# Patient Record
Sex: Male | Born: 1955
Health system: Southern US, Community
[De-identification: ages and names within clinical notes are randomized; demographics above are authoritative.]

## PROBLEM LIST (undated history)

## (undated) DIAGNOSIS — E785 Hyperlipidemia, unspecified: Secondary | ICD-10-CM

## (undated) DIAGNOSIS — I10 Essential (primary) hypertension: Secondary | ICD-10-CM

## (undated) DIAGNOSIS — C4490 Unspecified malignant neoplasm of skin, unspecified: Secondary | ICD-10-CM

## (undated) DIAGNOSIS — G43909 Migraine, unspecified, not intractable, without status migrainosus: Secondary | ICD-10-CM

## (undated) DIAGNOSIS — N3281 Overactive bladder: Secondary | ICD-10-CM

## (undated) DIAGNOSIS — E119 Type 2 diabetes mellitus without complications: Secondary | ICD-10-CM

## (undated) DIAGNOSIS — K219 Gastro-esophageal reflux disease without esophagitis: Secondary | ICD-10-CM

## (undated) DIAGNOSIS — E039 Hypothyroidism, unspecified: Secondary | ICD-10-CM

## (undated) HISTORY — DX: Gastro-esophageal reflux disease without esophagitis: K21.9

## (undated) HISTORY — DX: Overactive bladder: N32.81

## (undated) HISTORY — DX: Type 2 diabetes mellitus without complications: E11.9

## (undated) HISTORY — PX: HEMORRHOID BANDING: SHX5850

## (undated) HISTORY — DX: Migraine, unspecified, not intractable, without status migrainosus: G43.909

## (undated) HISTORY — DX: Hyperlipidemia, unspecified: E78.5

## (undated) HISTORY — DX: Unspecified malignant neoplasm of skin, unspecified: C44.90

## (undated) HISTORY — DX: Hypothyroidism, unspecified: E03.9

## (undated) HISTORY — DX: Essential (primary) hypertension: I10

## (undated) HISTORY — PX: WISDOM TOOTH EXTRACTION: SHX21

---

## 1986-04-16 HISTORY — PX: KNEE SURGERY: SHX244

## 2004-04-16 HISTORY — PX: MOHS SURGERY: SUR867

## 2006-04-16 HISTORY — PX: LASIK: SHX215

## 2009-04-16 HISTORY — PX: COLONOSCOPY: SHX174

## 2017-12-24 ENCOUNTER — Ambulatory Visit: Payer: Commercial Managed Care - PPO | Admitting: Primary Care

## 2017-12-24 ENCOUNTER — Encounter (INDEPENDENT_AMBULATORY_CARE_PROVIDER_SITE_OTHER): Payer: Self-pay

## 2017-12-24 ENCOUNTER — Encounter: Payer: Self-pay | Admitting: Primary Care

## 2017-12-24 VITALS — BP 140/80 | HR 86 | Temp 98.1°F | Ht 69.0 in | Wt 180.2 lb

## 2017-12-24 DIAGNOSIS — I10 Essential (primary) hypertension: Secondary | ICD-10-CM | POA: Insufficient documentation

## 2017-12-24 DIAGNOSIS — E785 Hyperlipidemia, unspecified: Secondary | ICD-10-CM

## 2017-12-24 DIAGNOSIS — E119 Type 2 diabetes mellitus without complications: Secondary | ICD-10-CM | POA: Diagnosis not present

## 2017-12-24 DIAGNOSIS — Z23 Encounter for immunization: Secondary | ICD-10-CM | POA: Diagnosis not present

## 2017-12-24 DIAGNOSIS — E039 Hypothyroidism, unspecified: Secondary | ICD-10-CM | POA: Diagnosis not present

## 2017-12-24 DIAGNOSIS — E1165 Type 2 diabetes mellitus with hyperglycemia: Secondary | ICD-10-CM | POA: Insufficient documentation

## 2017-12-24 DIAGNOSIS — Z794 Long term (current) use of insulin: Secondary | ICD-10-CM

## 2017-12-24 MED ORDER — INSULIN GLARGINE 100 UNIT/ML SOLOSTAR PEN: 38.0000 [IU] | PEN_INJECTOR | Freq: Every day | SUBCUTANEOUS | 3 refills | Status: DC

## 2017-12-24 MED ORDER — LEVOTHYROXINE SODIUM 75 MCG PO TABS: ORAL_TABLET | ORAL | 3 refills | Status: DC

## 2017-12-24 MED ORDER — BD PEN NEEDLE NANO U/F 32G X 4 MM MISC: 3 refills | Status: DC

## 2017-12-24 MED ORDER — SITAGLIP PHOS-METFORMIN HCL ER 50-1000 MG PO TB24: ORAL_TABLET | ORAL | 3 refills | Status: DC

## 2017-12-24 MED ORDER — OLMESARTAN MEDOXOMIL 5 MG PO TABS: ORAL_TABLET | ORAL | 3 refills | Status: DC

## 2017-12-24 MED ORDER — ATORVASTATIN CALCIUM 20 MG PO TABS: ORAL_TABLET | ORAL | 3 refills | Status: DC

## 2017-12-24 MED ORDER — EMPAGLIFLOZIN 25 MG PO TABS: ORAL_TABLET | ORAL | 3 refills | Status: DC

## 2017-12-24 NOTE — Patient Instructions (Signed)
You will be contacted regarding your referral to .  Please let us know if you have not been contacted within one week.   Start taking two of your olmesartan tablets once daily for blood pressure and kidney function. Monitor your blood pressure several times weekly and notify me if you see readings at or above 135/90 despite increasing your medication dose.   You will be contacted regarding your referral to endocrinology.  Please let us know if you have not been contacted within one week.   Please schedule a physical with me in 6 months. You may also schedule a lab only appointment 3-4 days prior. We will discuss your lab results in detail during your physical.  It was a pleasure to meet you today! Please don't hesitate to call or message me with any questions. Welcome to [facility]!

## 2017-12-24 NOTE — Assessment & Plan Note (Signed)
Compliant to regimen, prefers to follow with endocrinology. Referral placed. Continue current regimen. A1C pending.

## 2017-12-24 NOTE — Assessment & Plan Note (Signed)
Above goal in the office today, likely as he's not taking olmesartan as prescribed. Discussed to start taking 10 mg daily as prescribed. Refills sent to pharmacy. BMP pending.

## 2017-12-24 NOTE — Assessment & Plan Note (Signed)
Compliant to atorvastatin, continue same. Repeat lipids pending. 

## 2017-12-24 NOTE — Progress Notes (Signed)
Subjective:    Patient ID: Mitchell Donovan, male    DOB: Sep 05, 1955, 62 y.o.   MRN: [id number]  HPI  Mr. Mitchell Donovan is a 62 year old male who presents today to establish care and discuss the problems mentioned below. Will obtain old records.  1) Type 2 Diabetes: diagnosed in early 2000's. Currently managed on sitagliptin-metformin 50-1000 mg BID, empagliflozin 25 mg daily, Lantus 38 units AM. His last A1C was recently drawn through work which was 7.5, does not have copies of his results. He was following with endocrinology while living in [place], recently moved to [place].   2) Hypothyroidism: Currently managed on levothyroxine 75 mcg. His last TSH was normal 3-4 months ago. He denies recent change in levothyroxine dose.   3) Hyperlipidemia: Currently managed on atorvastatin 20 mg. His last cholesterol was checked two weeks ago through his occupation, think this was normal. He does not have copies of the lab report.  4) Essential Hypertension: Currently managed on olmesartan 5 mg BID but has been taking only once daily. He checks his blood pressure at home infrequently, no recent checks. He underwent chemical stress test x 2 in the past with last test being in 2018, negative exam. He also underwent carotid dopplers which didn't show obstruction. He denies chest pain, shortness of breath, dizziness/near syncope. Work up was completed due to reports of near syncope.  BP Readings from Last 3 Encounters:  12/24/17 140/80     Review of Systems  Eyes: Negative for visual disturbance.  Respiratory: Negative for shortness of breath.   Cardiovascular: Negative for chest pain.  Neurological: Negative for dizziness, numbness and headaches.       Past Medical History:  Diagnosis Date  . Type 2 diabetes mellitus ([place])      Social History   Socioeconomic History  . Marital status: Married    Spouse name: Not on file  . Number of children: Not on file  . Years of education: Not  on file  . Highest education level: Not on file  Occupational History  . Not on file  Social Needs  . Financial resource strain: Not on file  . Food insecurity:    Worry: Not on file    Inability: Not on file  . Transportation needs:    Medical: Not on file    Non-medical: Not on file  Tobacco Use  . Smoking status: Former Research scientist (life sciences)  . Smokeless tobacco: Never Used  Substance and Sexual Activity  . Alcohol use: Yes  . Drug use: Not on file  . Sexual activity: Not on file  Lifestyle  . Physical activity:    Days per week: Not on file    Minutes per session: Not on file  . Stress: Not on file  Relationships  . Social connections:    Talks on phone: Not on file    Gets together: Not on file    Attends religious service: Not on file    Active member of club or organization: Not on file    Attends meetings of clubs or organizations: Not on file    Relationship status: Not on file  . Intimate partner violence:    Fear of current or ex partner: Not on file    Emotionally abused: Not on file    Physically abused: Not on file    Forced sexual activity: Not on file  Other Topics Concern  . Not on file  Social History Narrative   Married.   1  child.   Works as an Chief Financial Officer for [employer].   Enjoys golfing, fishing, traveling.     Past Surgical History:  Procedure Laterality Date  . KNEE SURGERY Left   . LASIK    . MOHS SURGERY  2006   Nose    Family History  Problem Relation Age of Onset  . Heart disease Mother   . COPD Mother   . Heart disease Father     Allergies  Allergen Reactions  . Sporanos [Itraconazole] Swelling    No current outpatient medications on file prior to visit.   No current facility-administered medications on file prior to visit.     BP 140/80   Pulse 86   Temp 98.1 F (36.7 C) (Oral)   Ht 5\' 9"  (1.753 m)   Wt 180 lb 4 oz (81.8 kg)   SpO2 97%   BMI 26.62 kg/m    Objective:   Physical Exam  Constitutional: He appears  well-nourished.  Neck: Neck supple.  Cardiovascular: Normal rate and regular rhythm.  Respiratory: Effort normal and breath sounds normal.  Skin: Skin is warm and dry.  Psychiatric: He has a normal mood and affect.           Assessment & Plan:

## 2017-12-24 NOTE — Assessment & Plan Note (Signed)
Compliant to levothyroxine 75 mcg daily. TSH pending. Continue same.

## 2017-12-31 ENCOUNTER — Other Ambulatory Visit: Payer: Commercial Managed Care - PPO

## 2018-01-01 ENCOUNTER — Other Ambulatory Visit: Payer: Commercial Managed Care - PPO

## 2018-01-03 ENCOUNTER — Other Ambulatory Visit (INDEPENDENT_AMBULATORY_CARE_PROVIDER_SITE_OTHER): Payer: Commercial Managed Care - PPO

## 2018-01-03 DIAGNOSIS — I10 Essential (primary) hypertension: Secondary | ICD-10-CM

## 2018-01-03 DIAGNOSIS — E119 Type 2 diabetes mellitus without complications: Secondary | ICD-10-CM | POA: Diagnosis not present

## 2018-01-03 DIAGNOSIS — E785 Hyperlipidemia, unspecified: Secondary | ICD-10-CM | POA: Diagnosis not present

## 2018-01-03 DIAGNOSIS — Z794 Long term (current) use of insulin: Secondary | ICD-10-CM

## 2018-01-03 DIAGNOSIS — E039 Hypothyroidism, unspecified: Secondary | ICD-10-CM

## 2018-01-03 LAB — CBC WITH DIFFERENTIAL/PLATELET
BASOS PCT: 0.6 % (ref 0.0–3.0)
Basophils Absolute: 0 10*3/uL (ref 0.0–0.1)
EOS PCT: 2.3 % (ref 0.0–5.0)
Eosinophils Absolute: 0.1 10*3/uL (ref 0.0–0.7)
HEMATOCRIT: 45.3 % (ref 39.0–52.0)
HEMOGLOBIN: 15.7 g/dL (ref 13.0–17.0)
Lymphocytes Relative: 30.3 % (ref 12.0–46.0)
Lymphs Abs: 1.9 10*3/uL (ref 0.7–4.0)
MCHC: 34.8 g/dL (ref 30.0–36.0)
MCV: 91.7 fl (ref 78.0–100.0)
MONOS PCT: 7.2 % (ref 3.0–12.0)
Monocytes Absolute: 0.5 10*3/uL (ref 0.1–1.0)
NEUTROS ABS: 3.8 10*3/uL (ref 1.4–7.7)
Neutrophils Relative %: 59.6 % (ref 43.0–77.0)
PLATELETS: 249 10*3/uL (ref 150.0–400.0)
RBC: 4.94 Mil/uL (ref 4.22–5.81)
RDW: 12.8 % (ref 11.5–15.5)
WBC: 6.3 10*3/uL (ref 4.0–10.5)

## 2018-01-03 LAB — LIPID PANEL
CHOL/HDL RATIO: 3
Cholesterol: 109 mg/dL (ref 0–200)
HDL: 39 mg/dL — ABNORMAL LOW (ref >39.00)
LDL CALC: 46 mg/dL (ref 0–99)
NONHDL: 70.06
Triglycerides: 122 mg/dL (ref 0.0–149.0)
VLDL: 24.4 mg/dL (ref 0.0–40.0)

## 2018-01-03 LAB — TSH: TSH: 1.87 u[IU]/mL (ref 0.35–4.50)

## 2018-01-03 LAB — HEMOGLOBIN A1C: Hgb A1c MFr Bld: 7.9 % — ABNORMAL HIGH (ref 4.6–6.5)

## 2018-01-04 ENCOUNTER — Other Ambulatory Visit: Payer: Self-pay | Admitting: Primary Care

## 2018-01-04 DIAGNOSIS — E119 Type 2 diabetes mellitus without complications: Secondary | ICD-10-CM

## 2018-01-04 DIAGNOSIS — E039 Hypothyroidism, unspecified: Secondary | ICD-10-CM

## 2018-01-04 DIAGNOSIS — Z794 Long term (current) use of insulin: Principal | ICD-10-CM

## 2018-01-04 MED ORDER — INSULIN GLARGINE 100 UNIT/ML SOLOSTAR PEN: 40.0000 [IU] | PEN_INJECTOR | Freq: Every day | SUBCUTANEOUS | 3 refills | Status: DC

## 2018-01-04 NOTE — Assessment & Plan Note (Signed)
Recent TSH stable, continue levothyroxine 75 mcg. 

## 2018-01-04 NOTE — Assessment & Plan Note (Signed)
Recent A1C of 7.9, would like to see below 7.5. Increase Lantus 2 units to make 40. Continue oral meds. Work on diet and exercise, repeat in 3 months.

## 2018-01-10 ENCOUNTER — Other Ambulatory Visit: Payer: Self-pay | Admitting: Primary Care

## 2018-01-10 DIAGNOSIS — Z794 Long term (current) use of insulin: Principal | ICD-10-CM

## 2018-01-10 DIAGNOSIS — E119 Type 2 diabetes mellitus without complications: Secondary | ICD-10-CM

## 2018-01-10 MED ORDER — INSULIN GLARGINE 100 UNIT/ML SOLOSTAR PEN: 40.0000 [IU] | PEN_INJECTOR | Freq: Every day | SUBCUTANEOUS | 3 refills | Status: DC

## 2018-01-13 ENCOUNTER — Encounter: Payer: Self-pay | Admitting: *Deleted

## 2018-02-20 ENCOUNTER — Telehealth: Payer: Self-pay | Admitting: Primary Care

## 2018-02-20 DIAGNOSIS — E119 Type 2 diabetes mellitus without complications: Secondary | ICD-10-CM

## 2018-02-20 DIAGNOSIS — Z794 Long term (current) use of insulin: Principal | ICD-10-CM

## 2018-02-21 NOTE — Telephone Encounter (Signed)
Spoke to patient on 02/20/2018. He received a message regarding his Lantus.   I check it and was told that prior auth was request to contunie Lantus.  This was done today.  Left patient a detail message regarding this.

## 2018-02-25 MED ORDER — BASAGLAR KWIKPEN 100 UNIT/ML ~~LOC~~ SOPN: 40.0000 [IU] | PEN_INJECTOR | Freq: Every day | SUBCUTANEOUS | 5 refills | Status: DC

## 2018-02-25 NOTE — Telephone Encounter (Signed)
Please notify patient that his Lantus is not covered. I can send in an alternative to the Lantus called Basaglar. He will inject 40 units once nightly as he's been doing with Lantus. Is he out of Lantus now?

## 2018-02-25 NOTE — Telephone Encounter (Signed)
Pt called back cking on status of Lantus; pt notified as instructed and pt voiced understanding. Pt has about 1 1/2 months of Lantus left now; pt request to go ahead and send Basaglar rx to [facility] mail order pharmacy. Pt wants to know if the same pen needles will work on [employer] that work on lantus. If the pen needle pt has now will work nothing needs to be done but if a different needle is necessary send to [facility].

## 2018-02-25 NOTE — Telephone Encounter (Signed)
Noted.  Prescription for Basaglar sent to pharmacy as requested.  The pen needles should be sufficient for as Basaglar as well.

## 2018-02-26 NOTE — Telephone Encounter (Signed)
Spoken and notified patient of Kate Clark's comments. Patient verbalized understanding.  

## 2018-04-01 ENCOUNTER — Other Ambulatory Visit: Payer: Self-pay | Admitting: *Deleted

## 2018-04-01 DIAGNOSIS — Z794 Long term (current) use of insulin: Principal | ICD-10-CM

## 2018-04-01 DIAGNOSIS — E119 Type 2 diabetes mellitus without complications: Secondary | ICD-10-CM

## 2018-04-01 MED ORDER — BASAGLAR KWIKPEN 100 UNIT/ML ~~LOC~~ SOPN: 40.0000 [IU] | PEN_INJECTOR | Freq: Every day | SUBCUTANEOUS | 1 refills | Status: DC

## 2018-04-01 NOTE — Telephone Encounter (Signed)
Per DPR, left detail message of Kate Clark's comments for patient to call back 

## 2018-04-01 NOTE — Telephone Encounter (Signed)
Noted.  Refill sent to pharmacy. We refer patients frequently to [facility] in [place].  Also Dr. Nevada Crane and Dr. Loreli Dollar in [place].

## 2018-04-01 NOTE — Telephone Encounter (Signed)
Spoke to pt who states he his needing 90d supply of medication sent to [facility]. Pt is also states he is wanting to see a dermatologist and is wanting to know if Anda Kraft could recommend someone. pls advise

## 2018-04-08 NOTE — Telephone Encounter (Signed)
Per DPR, left detail message of Tawni Millers comments for patient to call back if needed

## 2018-06-10 ENCOUNTER — Telehealth: Payer: Self-pay

## 2018-06-10 NOTE — Telephone Encounter (Signed)
Pt request refill for olmesartan and atorvastatin to [facility]; advised to ck with pharmacy; refills for one yr on each sent to [facility] electronically on 12/24/17.pt voiced understanding and will contact [facility].

## 2018-06-18 ENCOUNTER — Telehealth: Payer: Self-pay

## 2018-06-18 DIAGNOSIS — E119 Type 2 diabetes mellitus without complications: Secondary | ICD-10-CM

## 2018-06-18 DIAGNOSIS — Z794 Long term (current) use of insulin: Principal | ICD-10-CM

## 2018-06-18 MED ORDER — BASAGLAR KWIKPEN 100 UNIT/ML ~~LOC~~ SOPN: 40.0000 [IU] | PEN_INJECTOR | Freq: Every day | SUBCUTANEOUS | 1 refills | Status: DC

## 2018-06-18 NOTE — Telephone Encounter (Signed)
45 ml would be correct.  Refill sent to pharmacy.

## 2018-06-18 NOTE — Telephone Encounter (Deleted)
Pt left v/m requesting a 90 day rx for basaglar sent to [facility]; pt thinks the # 45 ml is only a 38 day supply. But if pt is taking 0.4 ml per day then #45 ml would be a 90 day supply? Please advise.

## 2018-06-18 NOTE — Telephone Encounter (Signed)
Pt left v/m requesting increase in refill of basaglar to 90 day refill to [facility]; presently 45 ml is a 38 day supply per pt. Pt request cb. If pt takes 0.4 ml daily and there is 15 ml in a box; # 45 ml would be right at 90 day supply??Please advise.

## 2018-06-20 NOTE — Telephone Encounter (Signed)
Pt said he just got off phone with [facility] and was told that 40 units daily and #45 ml is a 113 day supply. Pt was also told that 90 day rx would be 2.4 boxes. I called [facility] at [phone] and spoke with Shirlee Limerick who said cannot break a box of insulin pens and I was transferred to [facility] (this is correct spelling) Makfhmi said ins will not cover 3 boxes and one box is 38 day supply.Makfhmi is checking with someone else to see about correct calculation. Makfhmi said that initial rx was #15 ml I said no it was #45 ml but #15 ml was sent out on 06/10/18 and on 07/12/18 3 boxes or #45 ml will be sent to pt. Pt said he has enough basaglar to last until receive next shipment. Pt appreciative and will cb if needed.

## 2018-09-09 ENCOUNTER — Other Ambulatory Visit: Payer: Self-pay | Admitting: *Deleted

## 2018-09-09 DIAGNOSIS — I10 Essential (primary) hypertension: Secondary | ICD-10-CM

## 2018-09-09 DIAGNOSIS — Z794 Long term (current) use of insulin: Secondary | ICD-10-CM

## 2018-09-09 DIAGNOSIS — E119 Type 2 diabetes mellitus without complications: Secondary | ICD-10-CM

## 2018-09-09 MED ORDER — OLMESARTAN MEDOXOMIL 5 MG PO TABS: ORAL_TABLET | ORAL | 1 refills | Status: DC

## 2018-09-09 NOTE — Telephone Encounter (Signed)
Patient left a voicemail for Vallarie Mare to call him back.

## 2018-09-09 NOTE — Telephone Encounter (Signed)
Checked the paper refills and did not see request.  Called patient and inform that we did not denied medications. I have sent refills as requested.

## 2018-09-09 NOTE — Telephone Encounter (Signed)
Patient left a voicemail stating that he had requested refills from [facility]. Patient stated that he got a message that the refills were denied. Patient requested a call back to discuss why they were denied.

## 2018-09-09 NOTE — Telephone Encounter (Signed)
No record of refill request in EMR, please check for faxed  paper refill request.

## 2018-09-09 NOTE — Telephone Encounter (Signed)
Per DPR, left detail message for patient to call back.

## 2018-09-10 NOTE — Telephone Encounter (Signed)
Patient called back regarding Basaglar. He stated that [facility] has been giving him just 2 boxes each refill even though we have it has 3 boxes. It is costing him more money since they would not send 3 boxes instead.  Patient asked if Mitchell Donovan can change the direction to 50 units but he will only use 40 units. So that why they will send 3 boxes and this is what he was told by [facility].  Please advise

## 2018-09-10 NOTE — Telephone Encounter (Signed)
Patient is returning a call from the office

## 2018-09-10 NOTE — Addendum Note (Signed)
Addended by: Jacqualin Combes on: 09/10/2018 12:54 PM   Modules accepted: Orders

## 2018-09-11 MED ORDER — BASAGLAR KWIKPEN 100 UNIT/ML ~~LOC~~ SOPN: 50.0000 [IU] | PEN_INJECTOR | Freq: Every day | SUBCUTANEOUS | 1 refills | Status: DC

## 2018-09-11 NOTE — Telephone Encounter (Signed)
Noted, changes made.

## 2018-09-11 NOTE — Addendum Note (Signed)
Addended by: Pleas Koch on: 09/11/2018 07:57 AM   Modules accepted: Orders

## 2018-10-23 ENCOUNTER — Other Ambulatory Visit: Payer: Self-pay | Admitting: Primary Care

## 2018-10-23 ENCOUNTER — Telehealth: Payer: Self-pay

## 2018-10-23 DIAGNOSIS — E039 Hypothyroidism, unspecified: Secondary | ICD-10-CM

## 2018-10-23 DIAGNOSIS — I10 Essential (primary) hypertension: Secondary | ICD-10-CM

## 2018-10-23 DIAGNOSIS — Z794 Long term (current) use of insulin: Secondary | ICD-10-CM

## 2018-10-23 DIAGNOSIS — Z125 Encounter for screening for malignant neoplasm of prostate: Secondary | ICD-10-CM

## 2018-10-23 DIAGNOSIS — Z1159 Encounter for screening for other viral diseases: Secondary | ICD-10-CM

## 2018-10-23 DIAGNOSIS — E785 Hyperlipidemia, unspecified: Secondary | ICD-10-CM

## 2018-10-23 DIAGNOSIS — E119 Type 2 diabetes mellitus without complications: Secondary | ICD-10-CM

## 2018-10-23 NOTE — Telephone Encounter (Signed)
Left detailed VM w COVID screen and back door lab info   

## 2018-10-27 ENCOUNTER — Other Ambulatory Visit: Payer: Self-pay

## 2018-10-27 ENCOUNTER — Other Ambulatory Visit (INDEPENDENT_AMBULATORY_CARE_PROVIDER_SITE_OTHER): Payer: Commercial Managed Care - PPO

## 2018-10-27 DIAGNOSIS — E039 Hypothyroidism, unspecified: Secondary | ICD-10-CM

## 2018-10-27 DIAGNOSIS — Z125 Encounter for screening for malignant neoplasm of prostate: Secondary | ICD-10-CM

## 2018-10-27 DIAGNOSIS — Z794 Long term (current) use of insulin: Secondary | ICD-10-CM

## 2018-10-27 DIAGNOSIS — I10 Essential (primary) hypertension: Secondary | ICD-10-CM | POA: Diagnosis not present

## 2018-10-27 DIAGNOSIS — E119 Type 2 diabetes mellitus without complications: Secondary | ICD-10-CM

## 2018-10-27 DIAGNOSIS — E785 Hyperlipidemia, unspecified: Secondary | ICD-10-CM

## 2018-10-27 DIAGNOSIS — Z1159 Encounter for screening for other viral diseases: Secondary | ICD-10-CM

## 2018-10-27 LAB — LIPID PANEL
Cholesterol: 119 mg/dL (ref 0–200)
HDL: 40.1 mg/dL (ref >39.00)
LDL Cholesterol: 45 mg/dL (ref 0–99)
NonHDL: 79.31
Total CHOL/HDL Ratio: 3
Triglycerides: 174 mg/dL — ABNORMAL HIGH (ref 0.0–149.0)
VLDL: 34.8 mg/dL (ref 0.0–40.0)

## 2018-10-27 LAB — COMPREHENSIVE METABOLIC PANEL
ALT: 21 U/L (ref 0–53)
AST: 16 U/L (ref 0–37)
Albumin: 4.6 g/dL (ref 3.5–5.2)
Alkaline Phosphatase: 86 U/L (ref 39–117)
BUN: 15 mg/dL (ref 6–23)
CO2: 26 mEq/L (ref 19–32)
Calcium: 9.1 mg/dL (ref 8.4–10.5)
Chloride: 107 mEq/L (ref 96–112)
Creatinine, Ser: 0.98 mg/dL (ref 0.40–1.50)
GFR: 77.85 mL/min (ref >60.00)
Glucose, Bld: 119 mg/dL — ABNORMAL HIGH (ref 70–99)
Potassium: 4.3 mEq/L (ref 3.5–5.1)
Sodium: 143 mEq/L (ref 135–145)
Total Bilirubin: 0.8 mg/dL (ref 0.2–1.2)
Total Protein: 6.7 g/dL (ref 6.0–8.3)

## 2018-10-27 LAB — PSA: PSA: 0.97 ng/mL (ref 0.10–4.00)

## 2018-10-27 LAB — TSH: TSH: 1.86 u[IU]/mL (ref 0.35–4.50)

## 2018-10-27 LAB — HEMOGLOBIN A1C: Hgb A1c MFr Bld: 7.6 % — ABNORMAL HIGH (ref 4.6–6.5)

## 2018-10-28 LAB — HEPATITIS C ANTIBODY
Hepatitis C Ab: NONREACTIVE
SIGNAL TO CUT-OFF: 0.01 (ref <1.00)

## 2018-10-29 ENCOUNTER — Ambulatory Visit (INDEPENDENT_AMBULATORY_CARE_PROVIDER_SITE_OTHER): Payer: Commercial Managed Care - PPO | Admitting: Primary Care

## 2018-10-29 ENCOUNTER — Other Ambulatory Visit: Payer: Self-pay

## 2018-10-29 ENCOUNTER — Encounter: Payer: Self-pay | Admitting: Primary Care

## 2018-10-29 VITALS — BP 138/86 | HR 89 | Temp 98.5°F | Ht 69.0 in | Wt 186.0 lb

## 2018-10-29 DIAGNOSIS — E119 Type 2 diabetes mellitus without complications: Secondary | ICD-10-CM

## 2018-10-29 DIAGNOSIS — Z23 Encounter for immunization: Secondary | ICD-10-CM | POA: Diagnosis not present

## 2018-10-29 DIAGNOSIS — K219 Gastro-esophageal reflux disease without esophagitis: Secondary | ICD-10-CM | POA: Insufficient documentation

## 2018-10-29 DIAGNOSIS — Z0000 Encounter for general adult medical examination without abnormal findings: Secondary | ICD-10-CM | POA: Insufficient documentation

## 2018-10-29 DIAGNOSIS — E785 Hyperlipidemia, unspecified: Secondary | ICD-10-CM

## 2018-10-29 DIAGNOSIS — Z0001 Encounter for general adult medical examination with abnormal findings: Secondary | ICD-10-CM | POA: Insufficient documentation

## 2018-10-29 DIAGNOSIS — E039 Hypothyroidism, unspecified: Secondary | ICD-10-CM

## 2018-10-29 DIAGNOSIS — I10 Essential (primary) hypertension: Secondary | ICD-10-CM

## 2018-10-29 DIAGNOSIS — Z794 Long term (current) use of insulin: Secondary | ICD-10-CM

## 2018-10-29 MED ORDER — OLMESARTAN MEDOXOMIL 20 MG PO TABS: 20.0000 mg | ORAL_TABLET | Freq: Every day | ORAL | 3 refills | Status: DC

## 2018-10-29 NOTE — Assessment & Plan Note (Signed)
Intermittent occurring once monthly. Discussed use of Tums vs famotidine.  Avoid triggers.

## 2018-10-29 NOTE — Progress Notes (Signed)
Subjective:    Patient ID: Mitchell Donovan, male    DOB: 1955-05-19, 63 y.o.   MRN: [id number]  HPI  Mitchell Donovan is a 63 year old male who presents today for complete physical.  Immunizations: -Tetanus: Completed in 2015 -Influenza: Due this season  -Pneumonia: Due, never completed per records -Shingles: Never completed, due  Diet: He endorses a fair diet.  Breakfast: Cereal, muffin, doughnut once weekly Lunch: Salad with protein Dinner: Protein, tacos, vegetables, little starches Snacks: Nuts Desserts: Cookies, doughnuts. Desserts 5 times weekly. Beverages: Unsweet tea, lemonade, Gatorade low sugar, water. Alcohol 4-5 nights weekly   Exercise: He is walking some, also golfing  Eye exam: Completed in 2019 Dental exam: Completes semi-annually  Colonoscopy: Completed in 2011, due in 2021 PSA: 0.97 in 2020 Hep C Screen: Negative in 2020  BP Readings from Last 3 Encounters:  10/29/18 138/86  12/24/17 140/80   He is checking his BP at home irregularly which is running 140's/80's.   Review of Systems  Constitutional: Negative for unexpected weight change.  HENT: Negative for rhinorrhea.   Respiratory: Negative for cough and shortness of breath.   Cardiovascular: Negative for chest pain.  Gastrointestinal: Negative for constipation and diarrhea.  Genitourinary: Negative for difficulty urinating.  Musculoskeletal: Positive for back pain.       Intermittent back pain, overall manages well on his own  Skin: Negative for rash.  Allergic/Immunologic: Negative for environmental allergies.  Neurological: Negative for dizziness, numbness and headaches.  Psychiatric/Behavioral: The patient is not nervous/anxious.        Past Medical History:  Diagnosis Date  . Essential hypertension   . Hyperlipidemia   . Hypothyroidism   . Migraines   . Skin cancer   . Type 2 diabetes mellitus ([place])      Social History   Socioeconomic History  . Marital status: Married   Spouse name: Not on file  . Number of children: Not on file  . Years of education: Not on file  . Highest education level: Not on file  Occupational History  . Not on file  Social Needs  . Financial resource strain: Not on file  . Food insecurity    Worry: Not on file    Inability: Not on file  . Transportation needs    Medical: Not on file    Non-medical: Not on file  Tobacco Use  . Smoking status: Former Research scientist (life sciences)  . Smokeless tobacco: Never Used  Substance and Sexual Activity  . Alcohol use: Yes  . Drug use: Not on file  . Sexual activity: Not on file  Lifestyle  . Physical activity    Days per week: Not on file    Minutes per session: Not on file  . Stress: Not on file  Relationships  . Social Herbalist on phone: Not on file    Gets together: Not on file    Attends religious service: Not on file    Active member of club or organization: Not on file    Attends meetings of clubs or organizations: Not on file    Relationship status: Not on file  . Intimate partner violence    Fear of current or ex partner: Not on file    Emotionally abused: Not on file    Physically abused: Not on file    Forced sexual activity: Not on file  Other Topics Concern  . Not on file  Social History Narrative   Married.   1  child.   Works as an Chief Financial Officer for [employer].   Enjoys golfing, fishing, traveling.     Past Surgical History:  Procedure Laterality Date  . KNEE SURGERY Left 1988  . LASIK  2008  . MOHS SURGERY  2006   Nose    Family History  Problem Relation Age of Onset  . Heart disease Mother   . COPD Mother   . Heart disease Father     Allergies  Allergen Reactions  . Sporanos [Itraconazole] Swelling    Current Outpatient Medications on File Prior to Visit  Medication Sig Dispense Refill  . atorvastatin (LIPITOR) 20 MG tablet Take 1 tablet by mouth once daily for cholesterol. 90 tablet 3  . BD PEN NEEDLE NANO U/F 32G X 4 MM MISC Use daily with insulin.  100 each 3  . empagliflozin (JARDIANCE) 25 MG TABS tablet Take 1 tablet by mouth once daily for diabetes. 90 tablet 3  . Insulin Glargine (BASAGLAR KWIKPEN) 100 UNIT/ML SOPN Inject 0.5 mLs (50 Units total) into the skin at bedtime. 45 mL 1  . levothyroxine (SYNTHROID, LEVOTHROID) 75 MCG tablet Take 1 tablet by mouth every morning on an empty stomach with water. No food or other medications for 30 minutes. 90 tablet 3  . SitaGLIPtin-MetFORMIN HCl (JANUMET XR) 50-1000 MG TB24 Take 1 tablet by mouth twice daily for diabetes. 180 tablet 3   No current facility-administered medications on file prior to visit.     BP 138/86   Pulse 89   Temp 98.5 F (36.9 C) (Temporal)   Ht 5\' 9"  (1.753 m)   Wt 186 lb (84.4 kg)   SpO2 98%   BMI 27.47 kg/m    Objective:   Physical Exam  Constitutional: He is oriented to person, place, and time. He appears well-nourished.  HENT:  Mouth/Throat: No oropharyngeal exudate.  Eyes: Pupils are equal, round, and reactive to light. EOM are normal.  Neck: Neck supple. No thyromegaly present.  Cardiovascular: Normal rate and regular rhythm.  Respiratory: Effort normal and breath sounds normal.  GI: Soft. Bowel sounds are normal. There is no abdominal tenderness.  Musculoskeletal: Normal range of motion.  Neurological: He is alert and oriented to person, place, and time.  Skin: Skin is warm and dry.  Psychiatric: He has a normal mood and affect.           Assessment & Plan:

## 2018-10-29 NOTE — Assessment & Plan Note (Signed)
Borderline in the office today, also above goal with home readings. Increase olmesartan to 20 mg. He will monitor BP at home and send readings in two weeks.   BMP unremarkable.

## 2018-10-29 NOTE — Assessment & Plan Note (Signed)
LDL well controlled on atorvastatin.  Continue same.

## 2018-10-29 NOTE — Assessment & Plan Note (Addendum)
Tetanus UTD, pneumonia vaccination due and provided today. Will schedule Shingrix vaccinations. PSA UTD. Colonoscopy due in 2021. Recommended to work on diet and increase exercise. Exam today stable. Labs reviewed. Follow up in 1 year for CPE.

## 2018-10-29 NOTE — Assessment & Plan Note (Signed)
Recent TSH stable. Taking levothyroxine with fiber water and other medications.  Discussed to take every morning on an empty stomach with water only. No food or other medications for 30 minutes. No heartburn medication, iron pills, calcium, vitamin D, or magnesium pills within four hours of taking levothyroxine.   Continue to monitor.

## 2018-10-29 NOTE — Addendum Note (Signed)
Addended by: Jacqualin Combes on: 10/29/2018 10:48 AM   Modules accepted: Orders

## 2018-10-29 NOTE — Patient Instructions (Addendum)
We've increased your olmesartan (Benicar) to 20 mg. I sent the 20 mg tablet to your mail order pharmacy.  Take 1 tablet by mouth once daily.   Continue exercising. You should be getting 150 minutes of moderate intensity exercise weekly.  Limit alcohol consumption and desserts as this will cause an increase I your blood sugars.  Ensure you are consuming 64 ounces of water daily.  Schedule two nurse visits for the Shingrix vaccination.  You were provided with the pneumonia vaccination which will cover you for 5 years.  Be sure to take your levothyroxine (thyroid medication) every morning on an empty stomach with water only. No food or other medications for 30 minutes. No heartburn medication, iron pills, calcium, vitamin D, or magnesium pills within four hours of taking levothyroxine.   Please schedule a follow up appointment in 6 months for a diabetes check.  It was a pleasure to see you today!   Preventive Care 61-61 Years Old, Male Preventive care refers to lifestyle choices and visits with your health care provider that can promote health and wellness. This includes:  A yearly physical exam. This is also called an annual well check.  Regular dental and eye exams.  Immunizations.  Screening for certain conditions.  Healthy lifestyle choices, such as eating a healthy diet, getting regular exercise, not using drugs or products that contain nicotine and tobacco, and limiting alcohol use. What can I expect for my preventive care visit? Physical exam Your health care provider will check:  Height and weight. These may be used to calculate body mass index (BMI), which is a measurement that tells if you are at a healthy weight.  Heart rate and blood pressure.  Your skin for abnormal spots. Counseling Your health care provider may ask you questions about:  Alcohol, tobacco, and drug use.  Emotional well-being.  Home and relationship well-being.  Sexual activity.  Eating  habits.  Work and work Statistician. What immunizations do I need?  Influenza (flu) vaccine  This is recommended every year. Tetanus, diphtheria, and pertussis (Tdap) vaccine  You may need a Td booster every 10 years. Varicella (chickenpox) vaccine  You may need this vaccine if you have not already been vaccinated. Zoster (shingles) vaccine  You may need this after age 53. Measles, mumps, and rubella (MMR) vaccine  You may need at least one dose of MMR if you were born in 1957 or later. You may also need a second dose. Pneumococcal conjugate (PCV13) vaccine  You may need this if you have certain conditions and were not previously vaccinated. Pneumococcal polysaccharide (PPSV23) vaccine  You may need one or two doses if you smoke cigarettes or if you have certain conditions. Meningococcal conjugate (MenACWY) vaccine  You may need this if you have certain conditions. Hepatitis A vaccine  You may need this if you have certain conditions or if you travel or work in places where you may be exposed to hepatitis A. Hepatitis B vaccine  You may need this if you have certain conditions or if you travel or work in places where you may be exposed to hepatitis B. Haemophilus influenzae type b (Hib) vaccine  You may need this if you have certain risk factors. Human papillomavirus (HPV) vaccine  If recommended by your health care provider, you may need three doses over 6 months. You may receive vaccines as individual doses or as more than one vaccine together in one shot (combination vaccines). Talk with your health care provider about the risks  and benefits of combination vaccines. What tests do I need? Blood tests  Lipid and cholesterol levels. These may be checked every 5 years, or more frequently if you are over 30 years old.  Hepatitis C test.  Hepatitis B test. Screening  Lung cancer screening. You may have this screening every year starting at age 24 if you have a  30-pack-year history of smoking and currently smoke or have quit within the past 15 years.  Prostate cancer screening. Recommendations will vary depending on your family history and other risks.  Colorectal cancer screening. All adults should have this screening starting at age 55 and continuing until age 32. Your health care provider may recommend screening at age 67 if you are at increased risk. You will have tests every 1-10 years, depending on your results and the type of screening test.  Diabetes screening. This is done by checking your blood sugar (glucose) after you have not eaten for a while (fasting). You may have this done every 1-3 years.  Sexually transmitted disease (STD) testing. Follow these instructions at home: Eating and drinking  Eat a diet that includes fresh fruits and vegetables, whole grains, lean protein, and low-fat dairy products.  Take vitamin and mineral supplements as recommended by your health care provider.  Do not drink alcohol if your health care provider tells you not to drink.  If you drink alcohol: ? Limit how much you have to 0-2 drinks a day. ? Be aware of how much alcohol is in your drink. In the [place] one drink equals one 12 oz bottle of beer (355 mL), one 5 oz glass of wine (148 mL), or one 1 oz glass of hard liquor (44 mL). Lifestyle  Take daily care of your teeth and gums.  Stay active. Exercise for at least 30 minutes on 5 or more days each week.  Do not use any products that contain nicotine or tobacco, such as cigarettes, e-cigarettes, and chewing tobacco. If you need help quitting, ask your health care provider.  If you are sexually active, practice safe sex. Use a condom or other form of protection to prevent STIs (sexually transmitted infections).  Talk with your health care provider about taking a low-dose aspirin every day starting at age 11. What's next?  Go to your health care provider once a year for a well check visit.  Ask  your health care provider how often you should have your eyes and teeth checked.  Stay up to date on all vaccines. This information is not intended to replace advice given to you by your health care provider. Make sure you discuss any questions you have with your health care provider. Document Released: 04/29/2015 Document Revised: 03/27/2018 Document Reviewed: 03/27/2018 Elsevier Patient Education  2020 [employer].

## 2018-10-29 NOTE — Assessment & Plan Note (Signed)
A1C from recent labs improved to 7.6. would like to see him below 7, discussed this today. He would like to work on diet and exercise.  Continue Basaglar at 40 units the AM. Continue Janmet and Jardiance. Managed on statin and ARB. Pneumonia vaccination due today. Eye exam UTD per patient.  Follow up in 6 months.

## 2018-11-05 DIAGNOSIS — E119 Type 2 diabetes mellitus without complications: Secondary | ICD-10-CM

## 2018-11-05 MED ORDER — JANUMET XR 50-1000 MG PO TB24: ORAL_TABLET | ORAL | 3 refills | Status: DC

## 2018-11-05 MED ORDER — JARDIANCE 25 MG PO TABS: ORAL_TABLET | ORAL | 3 refills | Status: DC

## 2018-11-05 MED ORDER — BASAGLAR KWIKPEN 100 UNIT/ML ~~LOC~~ SOPN: 40.0000 [IU] | PEN_INJECTOR | Freq: Every day | SUBCUTANEOUS | 1 refills | Status: DC

## 2018-11-05 MED ORDER — BD PEN NEEDLE NANO U/F 32G X 4 MM MISC: 3 refills | Status: DC

## 2018-12-29 ENCOUNTER — Telehealth: Payer: Self-pay | Admitting: Primary Care

## 2018-12-29 NOTE — Telephone Encounter (Signed)
Please notify patient that no lab work will be required ahead of time. We will complete a POC A1C that day.

## 2018-12-29 NOTE — Telephone Encounter (Signed)
Patient called to schedule 6 month follow up. He would like to know if you wanted him to do any lab work before the appointment   Thanks!

## 2018-12-30 NOTE — Telephone Encounter (Signed)
Per DPR, left detail message of Kate Clark's comments. 

## 2019-01-07 ENCOUNTER — Ambulatory Visit (INDEPENDENT_AMBULATORY_CARE_PROVIDER_SITE_OTHER): Payer: Commercial Managed Care - PPO | Admitting: *Deleted

## 2019-01-07 DIAGNOSIS — Z23 Encounter for immunization: Secondary | ICD-10-CM | POA: Diagnosis not present

## 2019-01-19 ENCOUNTER — Other Ambulatory Visit: Payer: Self-pay | Admitting: Primary Care

## 2019-01-19 DIAGNOSIS — Z794 Long term (current) use of insulin: Secondary | ICD-10-CM

## 2019-01-19 DIAGNOSIS — E119 Type 2 diabetes mellitus without complications: Secondary | ICD-10-CM

## 2019-02-22 ENCOUNTER — Other Ambulatory Visit: Payer: Self-pay | Admitting: Primary Care

## 2019-02-22 DIAGNOSIS — E785 Hyperlipidemia, unspecified: Secondary | ICD-10-CM

## 2019-02-22 DIAGNOSIS — E039 Hypothyroidism, unspecified: Secondary | ICD-10-CM

## 2019-03-18 ENCOUNTER — Telehealth: Payer: Self-pay | Admitting: *Deleted

## 2019-03-18 NOTE — Telephone Encounter (Signed)
Noted and agree with plan.

## 2019-03-18 NOTE — Telephone Encounter (Signed)
Patent called stating that he woke up this morning with a sore throat and that is the only symptom that he has. Patient stated that he and his wife went out of town last week for 3 days and stayed in a hotel and there were lots of people there. Patient stated that he did wear a mask at all times. Patient stated that he went for a walk last night in the cool air and that may be why his throat is sore.Reviewed all the covid symptoms with the patient. . Patient stated that he will hold off on getting tested for covid today, but if he still has any symptoms tomorrow he may go be tested. Patient was advised of testing sites. Patient stated that he works from home and does not plan on going out. ER precautions given to the patient and he verbalized understanding.

## 2019-04-16 ENCOUNTER — Other Ambulatory Visit: Payer: Self-pay

## 2019-04-16 ENCOUNTER — Ambulatory Visit: Payer: Commercial Managed Care - PPO

## 2019-04-21 ENCOUNTER — Ambulatory Visit: Payer: Commercial Managed Care - PPO | Admitting: Podiatry

## 2019-04-21 ENCOUNTER — Ambulatory Visit (INDEPENDENT_AMBULATORY_CARE_PROVIDER_SITE_OTHER): Payer: Commercial Managed Care - PPO

## 2019-04-21 ENCOUNTER — Other Ambulatory Visit: Payer: Self-pay

## 2019-04-21 ENCOUNTER — Encounter: Payer: Self-pay | Admitting: Podiatry

## 2019-04-21 DIAGNOSIS — M779 Enthesopathy, unspecified: Secondary | ICD-10-CM

## 2019-04-21 DIAGNOSIS — M659 Synovitis and tenosynovitis, unspecified: Secondary | ICD-10-CM | POA: Diagnosis not present

## 2019-04-21 MED ORDER — MELOXICAM 15 MG PO TABS: 15.0000 mg | ORAL_TABLET | Freq: Every day | ORAL | 1 refills | Status: DC

## 2019-04-24 NOTE — Progress Notes (Signed)
   Subjective:  64 y.o. male presenting today as a new patient with a chief complaint of dull aching pain to the left lateral ankle that began 6-7 months ago. He states he injured the ankle back in 2018 and began having a flare up of pain earlier this year. Walking, golfing and fly fishing increases the pain. He has been taking Aleve for treatment. Patient is here for further evaluation and treatment.    Past Medical History:  Diagnosis Date  . Essential hypertension   . Hyperlipidemia   . Hypothyroidism   . Migraines   . Skin cancer   . Type 2 diabetes mellitus ([place])      Objective / Physical Exam:  General:  The patient is alert and oriented x3 in no acute distress. Dermatology:  Skin is warm, dry and supple bilateral lower extremities. Negative for open lesions or macerations. Vascular:  Palpable pedal pulses bilaterally. No edema or erythema noted. Capillary refill within normal limits. Neurological:  Epicritic and protective threshold grossly intact bilaterally.  Musculoskeletal Exam:  Pain on palpation to the anterior lateral medial aspects of the patient's left ankle. Mild edema noted. Range of motion within normal limits to all pedal and ankle joints bilateral. Muscle strength 5/5 in all groups bilateral.   Radiographic Exam:  Normal osseous mineralization. Joint spaces preserved. No fracture/dislocation/boney destruction.    Assessment: 1. H/o ankle sprain left 2. Ankle synovitis left   Plan of Care:  1. Patient was evaluated. X-Rays reviewed.  2. Injection of 0.5 mL Celestone Soluspan injected in the patient's left ankle. 3. Prescription for Meloxicam provided to patient. 4. Ankle brace dispensed.  5. Return to clinic in 4 weeks.   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    Edrick Kins, DPM [facility]  Dr. Edrick Kins, [place]                                        [place], [place] [address]                  Office [phone]  Fax [phone]

## 2019-04-26 ENCOUNTER — Other Ambulatory Visit: Payer: Self-pay | Admitting: Primary Care

## 2019-04-26 DIAGNOSIS — Z794 Long term (current) use of insulin: Secondary | ICD-10-CM

## 2019-04-26 DIAGNOSIS — E119 Type 2 diabetes mellitus without complications: Secondary | ICD-10-CM

## 2019-04-27 MED ORDER — ONETOUCH ULTRA VI STRP: ORAL_STRIP | 3 refills | Status: DC

## 2019-04-30 ENCOUNTER — Encounter: Payer: Self-pay | Admitting: Primary Care

## 2019-04-30 ENCOUNTER — Ambulatory Visit: Payer: Commercial Managed Care - PPO | Admitting: Primary Care

## 2019-04-30 ENCOUNTER — Other Ambulatory Visit: Payer: Self-pay

## 2019-04-30 VITALS — BP 122/78 | HR 93 | Temp 98.0°F | Wt 184.8 lb

## 2019-04-30 DIAGNOSIS — R3912 Poor urinary stream: Secondary | ICD-10-CM | POA: Diagnosis not present

## 2019-04-30 DIAGNOSIS — Z23 Encounter for immunization: Secondary | ICD-10-CM

## 2019-04-30 DIAGNOSIS — Z794 Long term (current) use of insulin: Secondary | ICD-10-CM

## 2019-04-30 DIAGNOSIS — E119 Type 2 diabetes mellitus without complications: Secondary | ICD-10-CM | POA: Diagnosis not present

## 2019-04-30 LAB — POCT GLYCOSYLATED HEMOGLOBIN (HGB A1C): Hemoglobin A1C: 7.3 % — AB (ref 4.0–5.6)

## 2019-04-30 NOTE — Progress Notes (Signed)
Subjective:    Patient ID: Mitchell Donovan, male    DOB: [birth year], 64 y.o.   MRN: [id number]  HPI  This visit occurred during the SARS-CoV-2 public health emergency.  Safety protocols were in place, including screening questions prior to the visit, additional usage of staff PPE, and extensive cleaning of exam room while observing appropriate contact time as indicated for disinfecting solutions.   Mitchell Donovan is a 64 year old male with a history of type 2 diabetes, hypothyroidism, hypertension, hyperlipidemia who presents today for follow up of diabetes and his second Shingrix vaccine.  Current medications include: Basaglar 50 units HS, sitagliptin-metformin 50-1000 mg BID, Jardiance 25 mg daily. He is injecting 40 units due to insurance coverage.   He is checking his blood glucose 1 times daily and is getting readings of:  AM fasting: Below 100, occasionally up to 120.  Last A1C: 7.6 in July 2020 Last Eye Exam: Due today Last Foot Exam: Due today Pneumonia Vaccination: Completed in 2020 ACE/ARB: Olmesartan  Statin: atorvastatin   BP Readings from Last 3 Encounters:  04/30/19 122/78  10/29/18 138/86  12/24/17 140/80   He would also like to discuss difficulty urinating. Chronic for years, no worse but has become inconvenient. He notices that it takes him quite a while to initiate urination during the night when waking and also in the morning when he first wakes. He was told by his prior PCP that there was treatment. He is considering this.   Review of Systems  Eyes: Negative for visual disturbance.  Respiratory: Negative for shortness of breath.   Cardiovascular: Negative for chest pain.  Neurological: Negative for dizziness and headaches.       Chronic bilateral numbness to plantar feet       Past Medical History:  Diagnosis Date  . Essential hypertension   . Hyperlipidemia   . Hypothyroidism   . Migraines   . Skin cancer   . Type 2 diabetes mellitus ([place])        Social History   Socioeconomic History  . Marital status: Married    Spouse name: Not on file  . Number of children: Not on file  . Years of education: Not on file  . Highest education level: Not on file  Occupational History  . Not on file  Tobacco Use  . Smoking status: Former Research scientist (life sciences)  . Smokeless tobacco: Never Used  Substance and Sexual Activity  . Alcohol use: Yes  . Drug use: Not on file  . Sexual activity: Not on file  Other Topics Concern  . Not on file  Social History Narrative   Married.   1 child.   Works as an Chief Financial Officer for [employer].   Enjoys golfing, fishing, traveling.    Social Determinants of Health   Financial Resource Strain:   . Difficulty of Paying Living Expenses: Not on file  Food Insecurity:   . Worried About Charity fundraiser in the Last Year: Not on file  . Ran Out of Food in the Last Year: Not on file  Transportation Needs:   . Lack of Transportation (Medical): Not on file  . Lack of Transportation (Non-Medical): Not on file  Physical Activity:   . Days of Exercise per Week: Not on file  . Minutes of Exercise per Session: Not on file  Stress:   . Feeling of Stress : Not on file  Social Connections:   . Frequency of Communication with Friends and Family: Not on file  .  Frequency of Social Gatherings with Friends and Family: Not on file  . Attends Religious Services: Not on file  . Active Member of Clubs or Organizations: Not on file  . Attends Archivist Meetings: Not on file  . Marital Status: Not on file  Intimate Partner Violence:   . Fear of Current or Ex-Partner: Not on file  . Emotionally Abused: Not on file  . Physically Abused: Not on file  . Sexually Abused: Not on file    Past Surgical History:  Procedure Laterality Date  . KNEE SURGERY Left 1988  . LASIK  2008  . MOHS SURGERY  2006   Nose    Family History  Problem Relation Age of Onset  . Heart disease Mother   . COPD Mother   . Heart disease Father      Allergies  Allergen Reactions  . Sporanos [Itraconazole] Swelling    Current Outpatient Medications on File Prior to Visit  Medication Sig Dispense Refill  . atorvastatin (LIPITOR) 20 MG tablet TAKE 1 TABLET ONCE DAILY   FOR CHOLESTEROL 90 tablet 1  . BD PEN NEEDLE NANO 2ND GEN 32G X 4 MM MISC USE DAILY WITH INSULIN 100 each 3  . empagliflozin (JARDIANCE) 25 MG TABS tablet Take 1 tablet by mouth once daily for diabetes. 90 tablet 3  . Insulin Glargine (BASAGLAR KWIKPEN) 100 UNIT/ML SOPN INJECT 50 UNITS (0.5ML)    SUBCUTANEOUSLY AT BEDTIME 45 mL 1  . levothyroxine (SYNTHROID) 75 MCG tablet TAKE 1 TABLET EVERY MORNING ON AN EMPTY STOMACH WITH WATER. NO FOOD OR OTHER MEDICATIONS FOR 30 MINUTES 90 tablet 1  . meloxicam (MOBIC) 15 MG tablet Take 1 tablet (15 mg total) by mouth daily. 30 tablet 1  . olmesartan (BENICAR) 20 MG tablet Take 1 tablet (20 mg total) by mouth daily. For blood pressure. 90 tablet 3  . ONETOUCH ULTRA test strip Use as instructed to test blood sugar up to 4 times daily 300 each 3  . SitaGLIPtin-MetFORMIN HCl (JANUMET XR) 50-1000 MG TB24 Take 1 tablet by mouth twice daily for diabetes. 180 tablet 3   No current facility-administered medications on file prior to visit.    BP 122/78   Pulse 93   Temp 98 F (36.7 C) (Temporal)   Wt 184 lb 12.8 oz (83.8 kg)   SpO2 98%   BMI 27.29 kg/m    Objective:   Physical Exam  Constitutional: He appears well-nourished.  Cardiovascular: Normal rate and regular rhythm.  Respiratory: Effort normal and breath sounds normal.  Musculoskeletal:     Cervical back: Neck supple.  Skin: Skin is warm and dry.  Psychiatric: He has a normal mood and affect.           Assessment & Plan:

## 2019-04-30 NOTE — Patient Instructions (Signed)
Continue Basaglar 40 units daily. Continue other oral medications.  Continue to work on weight loss through a healthy diet.  Be sure to exercise. You should be getting 150 minutes of moderate intensity exercise weekly.  We will see you in 6 months for your annual physical.  It was a pleasure to see you today!

## 2019-04-30 NOTE — Assessment & Plan Note (Signed)
Reduction in A1C to 7.3 today, commended him on this. Fasting AM glucose readings are very good, encouraged him to check blood sugars during other times of the day.  Continue Basaglar 40 units (we order in 50 units for insurance coverage/supply), continue other oral meds.  Foot exam today. Eye exam today. pneumonia vaccine UTD. Managed on statin and ARB.  Follow up in 6 months.

## 2019-04-30 NOTE — Assessment & Plan Note (Signed)
Likely secondary to BPH, no other symptoms. Discussed options for treatment, he will think about this and update later.

## 2019-05-01 ENCOUNTER — Ambulatory Visit: Payer: Commercial Managed Care - PPO | Admitting: Primary Care

## 2019-05-05 DIAGNOSIS — E119 Type 2 diabetes mellitus without complications: Secondary | ICD-10-CM

## 2019-05-05 MED ORDER — BASAGLAR KWIKPEN 100 UNIT/ML ~~LOC~~ SOPN: PEN_INJECTOR | SUBCUTANEOUS | 2 refills | Status: DC

## 2019-05-19 ENCOUNTER — Other Ambulatory Visit: Payer: Self-pay

## 2019-05-19 ENCOUNTER — Ambulatory Visit: Payer: Commercial Managed Care - PPO | Admitting: Podiatry

## 2019-05-19 DIAGNOSIS — M659 Synovitis and tenosynovitis, unspecified: Secondary | ICD-10-CM | POA: Diagnosis not present

## 2019-05-19 DIAGNOSIS — M216X9 Other acquired deformities of unspecified foot: Secondary | ICD-10-CM | POA: Diagnosis not present

## 2019-05-19 MED ORDER — MELOXICAM 15 MG PO TABS: 15.0000 mg | ORAL_TABLET | Freq: Every day | ORAL | 1 refills | Status: AC

## 2019-05-20 ENCOUNTER — Ambulatory Visit (INDEPENDENT_AMBULATORY_CARE_PROVIDER_SITE_OTHER): Payer: Commercial Managed Care - PPO | Admitting: Orthotics

## 2019-05-20 DIAGNOSIS — M659 Synovitis and tenosynovitis, unspecified: Secondary | ICD-10-CM

## 2019-05-20 DIAGNOSIS — M216X9 Other acquired deformities of unspecified foot: Secondary | ICD-10-CM

## 2019-05-20 NOTE — Progress Notes (Signed)
Patient came in today for evaluation/assessment custom foot orthotics.  Patient presents foot pain and discomfort associated with plantar fasciitis.  Patient has noted pes cavus foot type with also rear foot varus deformity. ...Goal is to "bring ground up" with contoured arch support, and 4 degree valgus RF and FF posting.     

## 2019-05-22 NOTE — Progress Notes (Signed)
   Subjective:  64 y.o. male with PMHx of T2DM presenting today for follow up evaluation of left ankle pain. He states he is doing well and has improved greatly. He has been using the ankle brace daily which alleviates the pain. He denies any current worsening factors.  He reports intermittent sharp pain of the left fifth digit that began about 7 months ago. Walking increases the pain. He has not had any treatment. Patient is here for further evaluation and treatment.   Past Medical History:  Diagnosis Date  . Essential hypertension   . Hyperlipidemia   . Hypothyroidism   . Migraines   . Skin cancer   . Type 2 diabetes mellitus ([place])      Objective / Physical Exam:  General:  The patient is alert and oriented x3 in no acute distress. Dermatology:  Skin is warm, dry and supple bilateral lower extremities. Negative for open lesions or macerations. Vascular:  Palpable pedal pulses bilaterally. No edema or erythema noted. Capillary refill within normal limits. Neurological:  Epicritic and protective threshold grossly intact bilaterally.  Musculoskeletal Exam:  Pain on palpation to the anterior lateral medial aspects of the patient's left ankle. Mild edema noted. Increased medial longitudinal arch height noted to the bilateral feet. Range of motion within normal limits to all pedal and ankle joints bilateral. Muscle strength 5/5 in all groups bilateral.   Assessment: 1. H/o ankle sprain left 2. Ankle synovitis left - improved  3. Cavus foot type bilateral   Plan of Care:  1. Patient was evaluated. 2. Appointment with Liliane Channel, Pedorthist, for custom molded orthotics.  3. Refill prescription for Meloxicam provided to patient.  4. Continue using ankle brace.  5. Return to clinic as needed if patient needs another injection.   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    Edrick Kins, DPM [facility]  Dr. Edrick Kins, [place]                                         [place], [place] [address]                Office [phone]  Fax [phone]

## 2019-05-30 DIAGNOSIS — E119 Type 2 diabetes mellitus without complications: Secondary | ICD-10-CM

## 2019-05-30 DIAGNOSIS — E039 Hypothyroidism, unspecified: Secondary | ICD-10-CM

## 2019-05-30 DIAGNOSIS — E785 Hyperlipidemia, unspecified: Secondary | ICD-10-CM

## 2019-05-30 DIAGNOSIS — Z794 Long term (current) use of insulin: Secondary | ICD-10-CM

## 2019-05-30 MED ORDER — JANUMET XR 50-1000 MG PO TB24: ORAL_TABLET | ORAL | 3 refills | Status: DC

## 2019-05-30 MED ORDER — LEVOTHYROXINE SODIUM 75 MCG PO TABS: ORAL_TABLET | ORAL | 1 refills | Status: DC

## 2019-05-30 MED ORDER — ATORVASTATIN CALCIUM 20 MG PO TABS: ORAL_TABLET | ORAL | 1 refills | Status: DC

## 2019-06-17 ENCOUNTER — Other Ambulatory Visit: Payer: Commercial Managed Care - PPO | Admitting: Orthotics

## 2019-06-23 ENCOUNTER — Other Ambulatory Visit: Payer: Self-pay

## 2019-06-23 ENCOUNTER — Other Ambulatory Visit (INDEPENDENT_AMBULATORY_CARE_PROVIDER_SITE_OTHER): Payer: Commercial Managed Care - PPO | Admitting: Orthotics

## 2019-07-02 ENCOUNTER — Ambulatory Visit: Payer: Commercial Managed Care - PPO | Attending: Internal Medicine

## 2019-07-02 DIAGNOSIS — Z23 Encounter for immunization: Secondary | ICD-10-CM

## 2019-07-02 NOTE — Progress Notes (Signed)
   Covid-19 Vaccination Clinic  Name:  Mitchell Donovan    MRN: [id number] DOB: [birth year]  07/02/2019  Mitchell Donovan was observed post Covid-19 immunization for 30 minutes based on pre-vaccination screening without incident. He was provided with Vaccine Information Sheet and instruction to access the V-Safe system.   Mitchell Donovan was instructed to call 911 with any severe reactions post vaccine: Marland Kitchen Difficulty breathing  . Swelling of face and throat  . A fast heartbeat  . A bad rash all over body  . Dizziness and weakness   Immunizations Administered    Name Date Dose VIS Date Route   Pfizer COVID-19 Vaccine 07/02/2019 11:50 AM 0.3 mL 03/27/2019 Intramuscular   Manufacturer: [place]   Lot: [id number]   [place]: [id number]

## 2019-07-15 ENCOUNTER — Other Ambulatory Visit: Payer: Self-pay | Admitting: Podiatry

## 2019-07-15 DIAGNOSIS — M659 Synovitis and tenosynovitis, unspecified: Secondary | ICD-10-CM

## 2019-07-27 ENCOUNTER — Ambulatory Visit: Payer: Commercial Managed Care - PPO | Attending: Internal Medicine

## 2019-07-27 DIAGNOSIS — Z23 Encounter for immunization: Secondary | ICD-10-CM

## 2019-07-27 NOTE — Progress Notes (Signed)
   Covid-19 Vaccination Clinic  Name:  Mitchell Donovan    MRN: [id number] DOB: Aug 25, 1955  07/27/2019  Mr. Go was observed post Covid-19 immunization for 15 minutes without incident. He was provided with Vaccine Information Sheet and instruction to access the V-Safe system.   Mr. Babbit was instructed to call 911 with any severe reactions post vaccine: Marland Kitchen Difficulty breathing  . Swelling of face and throat  . A fast heartbeat  . A bad rash all over body  . Dizziness and weakness   Immunizations Administered    Name Date Dose VIS Date Route   Pfizer COVID-19 Vaccine 07/27/2019 11:26 AM 0.3 mL 03/27/2019 Intramuscular   Manufacturer: [place]   Lot: [id number]   [place]: [id number]

## 2019-08-05 ENCOUNTER — Ambulatory Visit: Payer: Commercial Managed Care - PPO | Admitting: Orthotics

## 2019-08-05 ENCOUNTER — Other Ambulatory Visit: Payer: Self-pay

## 2019-08-05 DIAGNOSIS — M216X9 Other acquired deformities of unspecified foot: Secondary | ICD-10-CM

## 2019-08-05 DIAGNOSIS — M659 Synovitis and tenosynovitis, unspecified: Secondary | ICD-10-CM

## 2019-08-05 NOTE — Progress Notes (Signed)
Patient came in today to pick up custom made foot orthotics.  The goals were accomplished and the patient reported no dissatisfaction with said orthotics.  Patient was advised of breakin period and how to report any issues. 

## 2019-08-15 ENCOUNTER — Other Ambulatory Visit: Payer: Self-pay | Admitting: Primary Care

## 2019-08-15 DIAGNOSIS — E119 Type 2 diabetes mellitus without complications: Secondary | ICD-10-CM

## 2019-10-14 ENCOUNTER — Other Ambulatory Visit: Payer: Self-pay | Admitting: Primary Care

## 2019-10-14 DIAGNOSIS — I10 Essential (primary) hypertension: Secondary | ICD-10-CM

## 2019-10-14 DIAGNOSIS — Z125 Encounter for screening for malignant neoplasm of prostate: Secondary | ICD-10-CM

## 2019-10-14 DIAGNOSIS — E039 Hypothyroidism, unspecified: Secondary | ICD-10-CM

## 2019-10-14 DIAGNOSIS — E119 Type 2 diabetes mellitus without complications: Secondary | ICD-10-CM

## 2019-10-14 DIAGNOSIS — Z114 Encounter for screening for human immunodeficiency virus [HIV]: Secondary | ICD-10-CM

## 2019-10-14 DIAGNOSIS — E785 Hyperlipidemia, unspecified: Secondary | ICD-10-CM

## 2019-10-23 ENCOUNTER — Other Ambulatory Visit: Payer: Self-pay | Admitting: Primary Care

## 2019-10-23 DIAGNOSIS — I10 Essential (primary) hypertension: Secondary | ICD-10-CM

## 2019-10-28 ENCOUNTER — Telehealth: Payer: Self-pay

## 2019-10-28 NOTE — Telephone Encounter (Addendum)
Pt said his wife took off tick from leg and thinks got everything but not sure if got head of tick; also will bring tick to appt.if can test tick for tick borne diseases. Pt scheduled in office appt on 10/29/19 at 9:15. Pt has no covid symptoms,  and no known exposure to + covid. Pt did travel on 10/24/19 by plane but went to [place] just to turn around and come home due to not being able to reach destination. Pt wore mask and social distanced. UC & ED precautions given and pt voiced understanding.

## 2019-10-28 NOTE — Telephone Encounter (Signed)
Noted. Thanks.

## 2019-10-29 ENCOUNTER — Other Ambulatory Visit: Payer: Self-pay

## 2019-10-29 ENCOUNTER — Ambulatory Visit: Payer: Commercial Managed Care - PPO | Admitting: Family Medicine

## 2019-10-29 ENCOUNTER — Encounter: Payer: Self-pay | Admitting: Family Medicine

## 2019-10-29 DIAGNOSIS — W57XXXA Bitten or stung by nonvenomous insect and other nonvenomous arthropods, initial encounter: Secondary | ICD-10-CM | POA: Diagnosis not present

## 2019-10-29 DIAGNOSIS — S70361A Insect bite (nonvenomous), right thigh, initial encounter: Secondary | ICD-10-CM | POA: Diagnosis not present

## 2019-10-29 MED ORDER — DOXYCYCLINE HYCLATE 100 MG PO TABS: 100.0000 mg | ORAL_TABLET | Freq: Two times a day (BID) | ORAL | 0 refills | Status: DC

## 2019-10-29 NOTE — Patient Instructions (Addendum)
Hold off with tick labs at this point.  Hold doxy for now.  If fever or feeling worse, then start and update [place].  Take care.  Glad to see you.

## 2019-10-29 NOTE — Progress Notes (Signed)
This visit occurred during the SARS-CoV-2 public health emergency.  Safety protocols were in place, including screening questions prior to the visit, additional usage of staff PPE, and extensive cleaning of exam room while observing appropriate contact time as indicated for disinfecting solutions.  Tick bite.   Looks like a small lone star tick, with white dot noted on the tick he brought in.  Bite on R lateral thigh.  Removed yesterday, had been golfing the day prior.  Tick is not engorged.  Wife extracted some material after the initial removal, cleaned the area with alcohol.   Sugar has been ~100-120 over the last week.    Meds, vitals, and allergies reviewed.   ROS: Per HPI unless specifically indicated in ROS section   GEN: nad, alert and oriented HEENT: ncat NECK: supple w/o LA CV: rrr.  PULM: ctab, no inc wob ABD: soft, +bs EXT: no edema SKIN: no acute rash but small healing tick bite site noted on R lateral thigh w/o retained FB noted.

## 2019-10-30 ENCOUNTER — Other Ambulatory Visit (INDEPENDENT_AMBULATORY_CARE_PROVIDER_SITE_OTHER): Payer: Commercial Managed Care - PPO

## 2019-10-30 DIAGNOSIS — Z794 Long term (current) use of insulin: Secondary | ICD-10-CM

## 2019-10-30 DIAGNOSIS — E119 Type 2 diabetes mellitus without complications: Secondary | ICD-10-CM

## 2019-10-30 DIAGNOSIS — Z114 Encounter for screening for human immunodeficiency virus [HIV]: Secondary | ICD-10-CM

## 2019-10-30 DIAGNOSIS — I10 Essential (primary) hypertension: Secondary | ICD-10-CM

## 2019-10-30 DIAGNOSIS — E785 Hyperlipidemia, unspecified: Secondary | ICD-10-CM

## 2019-10-30 DIAGNOSIS — Z125 Encounter for screening for malignant neoplasm of prostate: Secondary | ICD-10-CM | POA: Diagnosis not present

## 2019-10-30 DIAGNOSIS — E039 Hypothyroidism, unspecified: Secondary | ICD-10-CM

## 2019-10-30 LAB — LIPID PANEL
Cholesterol: 120 mg/dL (ref 0–200)
HDL: 41.8 mg/dL (ref >39.00)
LDL Cholesterol: 42 mg/dL (ref 0–99)
NonHDL: 78.09
Total CHOL/HDL Ratio: 3
Triglycerides: 182 mg/dL — ABNORMAL HIGH (ref 0.0–149.0)
VLDL: 36.4 mg/dL (ref 0.0–40.0)

## 2019-10-30 LAB — COMPREHENSIVE METABOLIC PANEL
ALT: 22 U/L (ref 0–53)
AST: 16 U/L (ref 0–37)
Albumin: 4.6 g/dL (ref 3.5–5.2)
Alkaline Phosphatase: 84 U/L (ref 39–117)
BUN: 22 mg/dL (ref 6–23)
CO2: 26 mEq/L (ref 19–32)
Calcium: 9.6 mg/dL (ref 8.4–10.5)
Chloride: 105 mEq/L (ref 96–112)
Creatinine, Ser: 1 mg/dL (ref 0.40–1.50)
GFR: 75.8 mL/min (ref >60.00)
Glucose, Bld: 121 mg/dL — ABNORMAL HIGH (ref 70–99)
Potassium: 4.5 mEq/L (ref 3.5–5.1)
Sodium: 141 mEq/L (ref 135–145)
Total Bilirubin: 0.9 mg/dL (ref 0.2–1.2)
Total Protein: 6.8 g/dL (ref 6.0–8.3)

## 2019-10-30 LAB — CBC
HCT: 48.8 % (ref 39.0–52.0)
Hemoglobin: 16.5 g/dL (ref 13.0–17.0)
MCHC: 33.9 g/dL (ref 30.0–36.0)
MCV: 93.2 fl (ref 78.0–100.0)
Platelets: 256 10*3/uL (ref 150.0–400.0)
RBC: 5.24 Mil/uL (ref 4.22–5.81)
RDW: 13.3 % (ref 11.5–15.5)
WBC: 8.5 10*3/uL (ref 4.0–10.5)

## 2019-10-30 LAB — HEMOGLOBIN A1C: Hgb A1c MFr Bld: 7.4 % — ABNORMAL HIGH (ref 4.6–6.5)

## 2019-10-30 LAB — TSH: TSH: 2 u[IU]/mL (ref 0.35–4.50)

## 2019-10-30 LAB — PSA: PSA: 1.13 ng/mL (ref 0.10–4.00)

## 2019-10-31 LAB — HIV ANTIBODY (ROUTINE TESTING W REFLEX): HIV 1&2 Ab, 4th Generation: NONREACTIVE

## 2019-11-01 DIAGNOSIS — W57XXXA Bitten or stung by nonvenomous insect and other nonvenomous arthropods, initial encounter: Secondary | ICD-10-CM

## 2019-11-01 HISTORY — DX: Bitten or stung by nonvenomous insect and other nonvenomous arthropods, initial encounter: W57.XXXA

## 2019-11-01 NOTE — Assessment & Plan Note (Signed)
Checking serology for tick associated diseases would likely not be useful at this point, given the timeline.  Discussed with patient.  He agrees.  Not systemically ill.  Still okay for outpatient follow-up. Alpha gal cautions d/w pt.  No hives.  Hold doxycycline for now.  If he has systemic symptoms or spreading cellulitis then start medication and update [place] but otherwise I expect him to do well and not need the medication.  He agrees.

## 2019-11-04 ENCOUNTER — Other Ambulatory Visit: Payer: Self-pay

## 2019-11-04 ENCOUNTER — Ambulatory Visit (INDEPENDENT_AMBULATORY_CARE_PROVIDER_SITE_OTHER): Payer: Commercial Managed Care - PPO | Admitting: Primary Care

## 2019-11-04 VITALS — BP 132/78 | HR 78 | Temp 96.9°F | Ht 69.0 in | Wt 183.8 lb

## 2019-11-04 DIAGNOSIS — K219 Gastro-esophageal reflux disease without esophagitis: Secondary | ICD-10-CM

## 2019-11-04 DIAGNOSIS — I10 Essential (primary) hypertension: Secondary | ICD-10-CM | POA: Diagnosis not present

## 2019-11-04 DIAGNOSIS — Z794 Long term (current) use of insulin: Secondary | ICD-10-CM

## 2019-11-04 DIAGNOSIS — W57XXXS Bitten or stung by nonvenomous insect and other nonvenomous arthropods, sequela: Secondary | ICD-10-CM

## 2019-11-04 DIAGNOSIS — E119 Type 2 diabetes mellitus without complications: Secondary | ICD-10-CM

## 2019-11-04 DIAGNOSIS — Z0000 Encounter for general adult medical examination without abnormal findings: Secondary | ICD-10-CM

## 2019-11-04 DIAGNOSIS — Z1211 Encounter for screening for malignant neoplasm of colon: Secondary | ICD-10-CM

## 2019-11-04 DIAGNOSIS — E039 Hypothyroidism, unspecified: Secondary | ICD-10-CM

## 2019-11-04 DIAGNOSIS — R3912 Poor urinary stream: Secondary | ICD-10-CM

## 2019-11-04 DIAGNOSIS — E785 Hyperlipidemia, unspecified: Secondary | ICD-10-CM

## 2019-11-04 MED ORDER — TAMSULOSIN HCL 0.4 MG PO CAPS: 0.4000 mg | ORAL_CAPSULE | Freq: Every day | ORAL | 0 refills | Status: DC

## 2019-11-04 NOTE — Assessment & Plan Note (Signed)
Recovering nicely, no evidence of Lyme Disease. Discussed to withhold from taking Doxycycline.

## 2019-11-04 NOTE — Assessment & Plan Note (Signed)
A1C of 7.4 on recent labs, stable compared to last check. Encouraged continued exercise, healthy diet.  Managed on statin and ARB. Foot exam UTD. Eye exam UTD per patient. Pneumonia vaccination UTD.  Follow up in 6 months.  Continue Basaglar 40 units, Janumet, Jardiance.

## 2019-11-04 NOTE — Assessment & Plan Note (Signed)
LDL at goal. Continue atorvastatin.  

## 2019-11-04 NOTE — Assessment & Plan Note (Signed)
Overall controlled in the office today, continue olmesartan 20 mg. CMP reviewed.

## 2019-11-04 NOTE — Assessment & Plan Note (Signed)
Immunizations UTD. PSA UTD. Colonoscopy due, referral placed to GI. Discussed the importance of a healthy diet and regular exercise in order for weight loss, and to reduce the risk of any potential medical problems.  Exam today benign. Labs reviewed.

## 2019-11-04 NOTE — Patient Instructions (Signed)
Continue exercising. You should be getting 150 minutes of moderate intensity exercise weekly.  Continue to work on a healthy diet. Ensure you are consuming 64 ounces of water daily.  You will be contacted regarding your referral to GI for the colonoscopy.  Please let us know if you have not been contacted within two weeks.   Start tamsulosin (Flomax) 0.4 mg daily for urinary symptoms. Please update me via My Chart in a few weeks.  Please schedule a follow up appointment in 6 months for diabetes check.   It was a pleasure to see you today!   Preventive Care 17-51 Years Old, Male Preventive care refers to lifestyle choices and visits with your health care provider that can promote health and wellness. This includes:  A yearly physical exam. This is also called an annual well check.  Regular dental and eye exams.  Immunizations.  Screening for certain conditions.  Healthy lifestyle choices, such as eating a healthy diet, getting regular exercise, not using drugs or products that contain nicotine and tobacco, and limiting alcohol use. What can I expect for my preventive care visit? Physical exam Your health care provider will check:  Height and weight. These may be used to calculate body mass index (BMI), which is a measurement that tells if you are at a healthy weight.  Heart rate and blood pressure.  Your skin for abnormal spots. Counseling Your health care provider may ask you questions about:  Alcohol, tobacco, and drug use.  Emotional well-being.  Home and relationship well-being.  Sexual activity.  Eating habits.  Work and work Statistician. What immunizations do I need?  Influenza (flu) vaccine  This is recommended every year. Tetanus, diphtheria, and pertussis (Tdap) vaccine  You may need a Td booster every 10 years. Varicella (chickenpox) vaccine  You may need this vaccine if you have not already been vaccinated. Zoster (shingles) vaccine  You may need  this after age 26. Measles, mumps, and rubella (MMR) vaccine  You may need at least one dose of MMR if you were born in 1957 or later. You may also need a second dose. Pneumococcal conjugate (PCV13) vaccine  You may need this if you have certain conditions and were not previously vaccinated. Pneumococcal polysaccharide (PPSV23) vaccine  You may need one or two doses if you smoke cigarettes or if you have certain conditions. Meningococcal conjugate (MenACWY) vaccine  You may need this if you have certain conditions. Hepatitis A vaccine  You may need this if you have certain conditions or if you travel or work in places where you may be exposed to hepatitis A. Hepatitis B vaccine  You may need this if you have certain conditions or if you travel or work in places where you may be exposed to hepatitis B. Haemophilus influenzae type b (Hib) vaccine  You may need this if you have certain risk factors. Human papillomavirus (HPV) vaccine  If recommended by your health care provider, you may need three doses over 6 months. You may receive vaccines as individual doses or as more than one vaccine together in one shot (combination vaccines). Talk with your health care provider about the risks and benefits of combination vaccines. What tests do I need? Blood tests  Lipid and cholesterol levels. These may be checked every 5 years, or more frequently if you are over 6 years old.  Hepatitis C test.  Hepatitis B test. Screening  Lung cancer screening. You may have this screening every year starting at age 34  if you have a 30-pack-year history of smoking and currently smoke or have quit within the past 15 years.  Prostate cancer screening. Recommendations will vary depending on your family history and other risks.  Colorectal cancer screening. All adults should have this screening starting at age 55 and continuing until age 49. Your health care provider may recommend screening at age 60 if  you are at increased risk. You will have tests every 1-10 years, depending on your results and the type of screening test.  Diabetes screening. This is done by checking your blood sugar (glucose) after you have not eaten for a while (fasting). You may have this done every 1-3 years.  Sexually transmitted disease (STD) testing. Follow these instructions at home: Eating and drinking  Eat a diet that includes fresh fruits and vegetables, whole grains, lean protein, and low-fat dairy products.  Take vitamin and mineral supplements as recommended by your health care provider.  Do not drink alcohol if your health care provider tells you not to drink.  If you drink alcohol: ? Limit how much you have to 0-2 drinks a day. ? Be aware of how much alcohol is in your drink. In the [place] one drink equals one 12 oz bottle of beer (355 mL), one 5 oz glass of wine (148 mL), or one 1 oz glass of hard liquor (44 mL). Lifestyle  Take daily care of your teeth and gums.  Stay active. Exercise for at least 30 minutes on 5 or more days each week.  Do not use any products that contain nicotine or tobacco, such as cigarettes, e-cigarettes, and chewing tobacco. If you need help quitting, ask your health care provider.  If you are sexually active, practice safe sex. Use a condom or other form of protection to prevent STIs (sexually transmitted infections).  Talk with your health care provider about taking a low-dose aspirin every day starting at age 87. What's next?  Go to your health care provider once a year for a well check visit.  Ask your health care provider how often you should have your eyes and teeth checked.  Stay up to date on all vaccines. This information is not intended to replace advice given to you by your health care provider. Make sure you discuss any questions you have with your health care provider. Document Revised: 03/27/2018 Document Reviewed: 03/27/2018 Elsevier Patient Education   2020 [employer].

## 2019-11-04 NOTE — Assessment & Plan Note (Signed)
Taking levothyroxine correctly, recent TSH normal. Continue levothyroxine 75 mcg.

## 2019-11-04 NOTE — Assessment & Plan Note (Signed)
Intermittent, using Pepcid PRN with improvement.  Continue to monitor.

## 2019-11-04 NOTE — Progress Notes (Signed)
Subjective:    Patient ID: Mitchell Donovan, male    DOB: 21-Jun-1955, 64 y.o.   MRN: [id number]  HPI  This visit occurred during the SARS-CoV-2 public health emergency.  Safety protocols were in place, including screening questions prior to the visit, additional usage of staff PPE, and extensive cleaning of exam room while observing appropriate contact time as indicated for disinfecting solutions.   Mitchell Donovan is a 64 year old male who presents today for complete physical.  Immunizations: -Tetanus: Completed in 2015 -Influenza: Due this season  -Shingles: Completed series  -Pneumonia: Completed Pneumovax in 2020 -Covid-19: Completed series   Diet: He endorses a healthy diet.  Exercise: He is exercising regularly   Eye exam: UTD Dental exam: UTD  Colonoscopy: Due PSA: 1.13 in 2021 Hep C Screen: Negative   BP Readings from Last 3 Encounters:  11/04/19 132/78  10/29/19 (!) 142/80  04/30/19 122/78    Wt Readings from Last 3 Encounters:  11/04/19 183 lb 12 oz (83.3 kg)  10/29/19 182 lb 6.4 oz (82.7 kg)  04/30/19 184 lb 12.8 oz (83.8 kg)     Review of Systems  Constitutional: Negative for unexpected weight change.  HENT: Negative for rhinorrhea.   Respiratory: Negative for cough and shortness of breath.   Cardiovascular: Negative for chest pain.  Gastrointestinal: Negative for constipation and diarrhea.  Genitourinary: Positive for difficulty urinating.       Nocturia, weak urinary stream   Musculoskeletal: Negative for arthralgias.  Skin: Negative for rash.  Allergic/Immunologic: Negative for environmental allergies.  Neurological: Negative for dizziness, numbness and headaches.  Psychiatric/Behavioral: The patient is not nervous/anxious.        Past Medical History:  Diagnosis Date  . Essential hypertension   . Hyperlipidemia   . Hypothyroidism   . Migraines   . Skin cancer   . Type 2 diabetes mellitus ([place])      Social History   Socioeconomic  History  . Marital status: Married    Spouse name: Not on file  . Number of children: Not on file  . Years of education: Not on file  . Highest education level: Not on file  Occupational History  . Not on file  Tobacco Use  . Smoking status: Former Research scientist (life sciences)  . Smokeless tobacco: Never Used  Substance and Sexual Activity  . Alcohol use: Yes  . Drug use: Not on file  . Sexual activity: Not on file  Other Topics Concern  . Not on file  Social History Narrative   Married.   1 child.   Works as an Chief Financial Officer for [employer].   Enjoys golfing, fishing, traveling.    Social Determinants of Health   Financial Resource Strain:   . Difficulty of Paying Living Expenses:   Food Insecurity:   . Worried About Charity fundraiser in the Last Year:   . Arboriculturist in the Last Year:   Transportation Needs:   . Film/video editor (Medical):   Marland Kitchen Lack of Transportation (Non-Medical):   Physical Activity:   . Days of Exercise per Week:   . Minutes of Exercise per Session:   Stress:   . Feeling of Stress :   Social Connections:   . Frequency of Communication with Friends and Family:   . Frequency of Social Gatherings with Friends and Family:   . Attends Religious Services:   . Active Member of Clubs or Organizations:   . Attends Archivist Meetings:   .  Marital Status:   Intimate Partner Violence:   . Fear of Current or Ex-Partner:   . Emotionally Abused:   Marland Kitchen Physically Abused:   . Sexually Abused:     Past Surgical History:  Procedure Laterality Date  . KNEE SURGERY Left 1988  . LASIK  2008  . MOHS SURGERY  2006   Nose    Family History  Problem Relation Age of Onset  . Heart disease Mother   . COPD Mother   . Heart disease Father     Allergies  Allergen Reactions  . Sporanos [Itraconazole] Swelling    Current Outpatient Medications on File Prior to Visit  Medication Sig Dispense Refill  . atorvastatin (LIPITOR) 20 MG tablet TAKE 1 TABLET ONCE DAILY    FOR CHOLESTEROL 90 tablet 1  . BD PEN NEEDLE NANO 2ND GEN 32G X 4 MM MISC USE DAILY WITH INSULIN 100 each 3  . empagliflozin (JARDIANCE) 25 MG TABS tablet Take 1 tablet by mouth once daily for diabetes. 90 tablet 3  . Insulin Glargine (BASAGLAR KWIKPEN) 100 UNIT/ML INJECT 0.4ML (40 UNITS     TOTAL) SUBCUTANEOUSLY AT   BEDTIME 45 mL 0  . levothyroxine (SYNTHROID) 75 MCG tablet TAKE 1 TABLET EVERY MORNING ON AN EMPTY STOMACH WITH WATER. NO FOOD OR OTHER MEDICATIONS FOR 30 MINUTES 90 tablet 1  . olmesartan (BENICAR) 20 MG tablet TAKE 1 TABLET DAILY FOR    BLOOD PRESSURE 90 tablet 3  . ONETOUCH ULTRA test strip Use as instructed to test blood sugar up to 4 times daily 300 each 3  . SitaGLIPtin-MetFORMIN HCl (JANUMET XR) 50-1000 MG TB24 Take 1 tablet by mouth twice daily for diabetes. 180 tablet 3   No current facility-administered medications on file prior to visit.    BP 132/78   Pulse 78   Temp (!) 96.9 F (36.1 C) (Temporal)   Ht 5\' 9"  (1.753 m)   Wt 183 lb 12 oz (83.3 kg)   SpO2 98%   BMI 27.14 kg/m    Objective:   Physical Exam HENT:     Right Ear: Tympanic membrane and ear canal normal.     Left Ear: Tympanic membrane and ear canal normal.  Eyes:     Pupils: Pupils are equal, round, and reactive to light.  Cardiovascular:     Rate and Rhythm: Normal rate and regular rhythm.  Pulmonary:     Effort: Pulmonary effort is normal.     Breath sounds: Normal breath sounds.  Abdominal:     General: Bowel sounds are normal.     Palpations: Abdomen is soft.     Tenderness: There is no abdominal tenderness.  Musculoskeletal:        General: Normal range of motion.     Cervical back: Neck supple.  Skin:    General: Skin is warm and dry.     Comments: Tick bite site appears to be healing well, no rashes or evidence of Lyme Disease   Neurological:     Mental Status: He is alert and oriented to person, place, and time.     Cranial Nerves: No cranial nerve deficit.     Deep Tendon  Reflexes:     Reflex Scores:      Patellar reflexes are 2+ on the right side and 2+ on the left side. Psychiatric:        Mood and Affect: Mood normal.            Assessment & Plan:

## 2019-11-04 NOTE — Assessment & Plan Note (Addendum)
Continued. Symptoms include difficulty urinating and nocturia. He is ready to try treatment. Rx for Flomax 0.4 mg. He will update in a few weeks.

## 2019-11-08 DIAGNOSIS — R3912 Poor urinary stream: Secondary | ICD-10-CM

## 2019-11-09 MED ORDER — FINASTERIDE 5 MG PO TABS: 5.0000 mg | ORAL_TABLET | Freq: Every day | ORAL | 0 refills | Status: DC

## 2019-11-13 ENCOUNTER — Other Ambulatory Visit: Payer: Self-pay | Admitting: Primary Care

## 2019-11-13 DIAGNOSIS — E119 Type 2 diabetes mellitus without complications: Secondary | ICD-10-CM

## 2019-11-13 DIAGNOSIS — Z794 Long term (current) use of insulin: Secondary | ICD-10-CM

## 2019-11-19 ENCOUNTER — Other Ambulatory Visit: Payer: Self-pay | Admitting: Primary Care

## 2019-11-19 DIAGNOSIS — E119 Type 2 diabetes mellitus without complications: Secondary | ICD-10-CM

## 2019-11-20 DIAGNOSIS — Z794 Long term (current) use of insulin: Secondary | ICD-10-CM

## 2019-11-20 MED ORDER — BD PEN NEEDLE NANO 2ND GEN 32G X 4 MM MISC: 3 refills | Status: DC

## 2019-11-22 ENCOUNTER — Other Ambulatory Visit: Payer: Self-pay | Admitting: Primary Care

## 2019-11-22 DIAGNOSIS — Z794 Long term (current) use of insulin: Secondary | ICD-10-CM

## 2019-12-08 ENCOUNTER — Other Ambulatory Visit: Payer: Self-pay | Admitting: Primary Care

## 2019-12-08 DIAGNOSIS — R3912 Poor urinary stream: Secondary | ICD-10-CM

## 2019-12-08 NOTE — Telephone Encounter (Signed)
How are his urinary symptoms since we added the new finasteride medication?

## 2019-12-08 NOTE — Telephone Encounter (Signed)
Last prescribed on 11/09/2019 Last OV (cpe) with Allie Bossier on 11/08/2019  Future OV scheduled on 05/09/2020

## 2019-12-09 NOTE — Telephone Encounter (Signed)
Message left for patient to return my call.  

## 2020-02-01 ENCOUNTER — Ambulatory Visit: Payer: Commercial Managed Care - PPO | Attending: Internal Medicine

## 2020-02-01 DIAGNOSIS — Z23 Encounter for immunization: Secondary | ICD-10-CM

## 2020-02-01 NOTE — Progress Notes (Signed)
   Covid-19 Vaccination Clinic  Name:  Mitchell Donovan    MRN: [id number] DOB: October 04, 1955  02/01/2020  Mitchell Donovan was observed post Covid-19 immunization for 15 minutes without incident. He was provided with Vaccine Information Sheet and instruction to access the V-Safe system.   Mitchell Donovan was instructed to call 911 with any severe reactions post vaccine: Marland Kitchen Difficulty breathing  . Swelling of face and throat  . A fast heartbeat  . A bad rash all over body  . Dizziness and weakness

## 2020-02-03 ENCOUNTER — Ambulatory Visit: Payer: Commercial Managed Care - PPO

## 2020-02-10 ENCOUNTER — Other Ambulatory Visit: Payer: Self-pay | Admitting: Primary Care

## 2020-02-10 DIAGNOSIS — E785 Hyperlipidemia, unspecified: Secondary | ICD-10-CM

## 2020-02-10 DIAGNOSIS — E039 Hypothyroidism, unspecified: Secondary | ICD-10-CM

## 2020-03-03 ENCOUNTER — Other Ambulatory Visit: Payer: Self-pay

## 2020-03-03 ENCOUNTER — Ambulatory Visit (INDEPENDENT_AMBULATORY_CARE_PROVIDER_SITE_OTHER): Payer: Commercial Managed Care - PPO

## 2020-03-03 DIAGNOSIS — Z23 Encounter for immunization: Secondary | ICD-10-CM | POA: Diagnosis not present

## 2020-03-04 ENCOUNTER — Encounter: Payer: Self-pay | Admitting: Nurse Practitioner

## 2020-03-18 ENCOUNTER — Other Ambulatory Visit: Payer: Commercial Managed Care - PPO

## 2020-03-18 DIAGNOSIS — Z20822 Contact with and (suspected) exposure to covid-19: Secondary | ICD-10-CM

## 2020-03-19 LAB — SARS-COV-2, NAA 2 DAY TAT

## 2020-03-19 LAB — NOVEL CORONAVIRUS, NAA: SARS-CoV-2, NAA: NOT DETECTED

## 2020-03-28 ENCOUNTER — Encounter: Payer: Self-pay | Admitting: Nurse Practitioner

## 2020-03-28 ENCOUNTER — Ambulatory Visit: Payer: Commercial Managed Care - PPO | Admitting: Nurse Practitioner

## 2020-03-28 VITALS — BP 140/74 | HR 92 | Ht 68.0 in | Wt 184.5 lb

## 2020-03-28 DIAGNOSIS — Z1211 Encounter for screening for malignant neoplasm of colon: Secondary | ICD-10-CM

## 2020-03-28 DIAGNOSIS — R0602 Shortness of breath: Secondary | ICD-10-CM | POA: Diagnosis not present

## 2020-03-28 DIAGNOSIS — R142 Eructation: Secondary | ICD-10-CM

## 2020-03-28 NOTE — Patient Instructions (Signed)
If you are age 62 or older, your body mass index should be between 23-30. Your Body mass index is 28.05 kg/m. If this is out of the aforementioned range listed, please consider follow up with your Primary Care Provider.  If you are age 78 or younger, your body mass index should be between 19-25. Your Body mass index is 28.05 kg/m. If this is out of the aformentioned range listed, please consider follow up with your Primary Care Provider.   Call office after cardiology evaluation to scheduled endoscopy/colonoscopy.

## 2020-03-28 NOTE — Progress Notes (Signed)
03/28/2020 Mitchell Donovan [id number] 08-Oct-1955   CHIEF COMPLAINT:  Schedule a colonoscopy   HISTORY OF PRESENT ILLNESS:  Mitchell Donovan is a 64 year old male with a past medical history of hypertension, hyperlipidemia, hypothyroidism, remote history of migraine headaches, DM II and skin cancer. He presents today as referred by Loma Boston, NP to schedule a screening colonoscopy.  He underwent a screening colonoscopy in [place] 10 years ago, he reported a few benign colon polyps were removed.  He denies having any upper or lower abdominal pain.  He is passing a normal formed brown bowel movement daily.  No family history of colon polyps or colon cancer.  He denies having any heartburn or dysphagia.  However, he reports having episodes of possibly having gas build up which causes shortness of breath.  He reports having 4-5 episodes since 2019 when he develops difficulty breathing when walking which usually occurs a while after he eats breakfast and drinks one cup of coffee. He describes feeling pressure to his lower chest with the sensation of gas build up resulting in difficulty taking a deep breath in with SOB.  No cough.  He gradually burps and his symptoms improve. No associated dizziness or palpitations. The most recent episode occurred a few days ago while walking with his wife.  He reported seeing a cardiologist in 2018 while living in [place].  He stated a cardiac stress test was negative at that time.  His mother and father have a history of heart disease in both parents required coronary bypass surgery.  Three paternal uncles died from heart disease in their 66s.  Patient has a history of hypertension, hypercholesterolemia, diabetes.  Remote tobacco history, occasionally smokes cigars.  Past Medical History:  Diagnosis Date  . Essential hypertension   . Hyperlipidemia   . Hypothyroidism   . Migraines   . Overactive bladder   . Skin cancer   . Type 2 diabetes mellitus  ([place])    Past Surgical History:  Procedure Laterality Date  . KNEE SURGERY Left 1988  . LASIK  2008  . MOHS SURGERY  2006   Nose    Social History: He is married. He is an Chief Financial Officer. He smoked cigarettes mid 20's. Occasional cigar. He drinks whiskey in ginger ale 2 drinks 5 days weekly. Marijuana during college years.   Family History: Father died age 51 old age by pass bypass. Mother died age 69 after heart bypass surgery, possibly had diabetes.    Allergies  Allergen Reactions  . Sporanos [Itraconazole] Swelling      Outpatient Encounter Medications as of 03/28/2020  Medication Sig  . aspirin EC 81 MG tablet Take 81 mg by mouth daily. Swallow whole.  Marland Kitchen atorvastatin (LIPITOR) 20 MG tablet TAKE 1 TABLET DAILY FOR    CHOLESTEROL  . empagliflozin (JARDIANCE) 25 MG TABS tablet TAKE 1 TABLET DAILY FOR    DIABETES  . Insulin Glargine (BASAGLAR KWIKPEN) 100 UNIT/ML INJECT 40 UNITS            SUBCUTANEOUSLY AT BEDTIME  . Insulin Pen Needle (BD PEN NEEDLE NANO 2ND GEN) 32G X 4 MM MISC USE DAILY WITH INSULIN  . levothyroxine (SYNTHROID) 75 MCG tablet TAKE 1 TABLET EVERY MORNING ON AN EMPTY STOMACH WITH WATER, NO FOOD OR OTHER MEDICATIONS FOR 30 MINUTES.  Marland Kitchen olmesartan (BENICAR) 20 MG tablet TAKE 1 TABLET DAILY FOR    BLOOD PRESSURE  . ONETOUCH ULTRA test strip Use as instructed to test blood sugar up  to 4 times daily  . SitaGLIPtin-MetFORMIN HCl (JANUMET XR) 50-1000 MG TB24 Take 1 tablet by mouth twice daily for diabetes.  . [DISCONTINUED] finasteride (PROSCAR) 5 MG tablet Take 1 tablet (5 mg total) by mouth daily. For urinary symptoms   No facility-administered encounter medications on file as of 03/28/2020.     REVIEW OF SYSTEMS:  Gen: Denies fever, sweats or chills. No weight loss.  CV: See HPI. Resp: See HPI. GI: See HPI.  Denies heartburn, dysphagia, stomach or lower abdominal pain. No diarrhea or constipation.  GU : Denies urinary burning, blood in urine, increased urinary  frequency or incontinence. MS: Denies joint pain, muscles aches or weakness. Derm: Denies rash, itchiness, skin lesions or unhealing ulcers. Psych: Denies depression, anxiety or memory loss. Heme: Denies bruising, bleeding. Neuro:  Denies headaches, dizziness or paresthesias. Endo:  Denies any problems with DM, thyroid or adrenal function.    PHYSICAL EXAM: BP 140/74 (BP Location: Left Arm, Patient Position: Sitting, Cuff Size: Normal)   Pulse 92   Ht 5\' 8"  (1.727 m) Comment: height measured without shoes  Wt 184 lb 8 oz (83.7 kg)   BMI 28.05 kg/m  General: Well developed 64 year old male in no acute distress. Head: Normocephalic and atraumatic. Eyes:  Sclerae non-icteric, conjunctive pink. Ears: Normal auditory acuity. Mouth: Dentition intact. No ulcers or lesions.  Neck: Supple, no lymphadenopathy or thyromegaly.  Lungs: Clear bilaterally to auscultation without wheezes, crackles or rhonchi. Heart: Regular rate and rhythm. No murmur, rub or gallop appreciated.  Abdomen: Soft, nontender, non distended. No masses. No hepatosplenomegaly. Normoactive bowel sounds x 4 quadrants.  Rectal: Deferred. Musculoskeletal: Symmetrical with no gross deformities. Skin: Warm and dry. No rash or lesions on visible extremities. Extremities: No edema. Neurological: Alert oriented x 4, no focal deficits.  Psychological:  Alert and cooperative. Normal mood and affect.  ASSESSMENT AND PLAN:  66.  64 year old male presents to schedule screening colonoscopy -Colonoscopy benefits and risks discussed including risk with sedation, risk of bleeding, perforation and infection  -Colonoscopy to be scheduled after cardiology evaluation completed, see #2  2.  Atypical chest pressure relieved by burping suggestive of GI etiology, however, due to numerous risk factors for coronary artery disease as noted in the HPI recommended a cardiology evaluation recommended prior to scheduling a colonoscopy and EGD.   Patient reported complete a negative cardiac stress test in [place] in 2018. -Eventual EGD -Patient to contact his PCP to facilitate cardiology consult, await further recommendations from Dr. Havery Moros  3. DM II      CC:  Pleas Koch, NP

## 2020-03-29 NOTE — Progress Notes (Signed)
Agree with assessment and plan as outlined.  If you think there is potential cardiac cause to his symptoms can await cardiology evaluation first and then scheduled once cleared.

## 2020-04-01 DIAGNOSIS — Z8249 Family history of ischemic heart disease and other diseases of the circulatory system: Secondary | ICD-10-CM

## 2020-04-01 DIAGNOSIS — R0609 Other forms of dyspnea: Secondary | ICD-10-CM

## 2020-05-09 ENCOUNTER — Other Ambulatory Visit: Payer: Self-pay

## 2020-05-09 ENCOUNTER — Ambulatory Visit: Payer: Commercial Managed Care - PPO | Admitting: Primary Care

## 2020-05-09 VITALS — BP 122/72 | HR 80 | Temp 97.9°F | Ht 68.0 in | Wt 198.0 lb

## 2020-05-09 DIAGNOSIS — R3912 Poor urinary stream: Secondary | ICD-10-CM

## 2020-05-09 DIAGNOSIS — E119 Type 2 diabetes mellitus without complications: Secondary | ICD-10-CM

## 2020-05-09 DIAGNOSIS — Z794 Long term (current) use of insulin: Secondary | ICD-10-CM

## 2020-05-09 LAB — POCT GLYCOSYLATED HEMOGLOBIN (HGB A1C): Hemoglobin A1C: 7.2 % — AB (ref 4.0–5.6)

## 2020-05-09 MED ORDER — TADALAFIL 5 MG PO TABS: 5.0000 mg | ORAL_TABLET | Freq: Every day | ORAL | 0 refills | Status: DC

## 2020-05-09 NOTE — Assessment & Plan Note (Signed)
Relief with tamsulosin 0.4 mg but could not tolerate due to side effects of nasal congestion. Finasteride was ineffective, but he likely did not give this medication a long enough trial.  Rx for Cialis 5 mg daily provided.  He will update.

## 2020-05-09 NOTE — Assessment & Plan Note (Signed)
Improved with A1C of 7.2 today, commended him on this! Continue current regimen of Basaglar 40 units daily, jardiance 25 mg, sitagliptan-metformin 50-1000 BID.   Foot exam today. Eye exam due in March 2022. Pneumonia vaccination UTD. Managed on ARB and statin.   Follow up in 6 months.

## 2020-05-09 NOTE — Progress Notes (Signed)
Subjective:    Patient ID: Mitchell Donovan, male    DOB: 05-Feb-1956, 65 y.o.   MRN: [id number]  HPI  This visit occurred during the SARS-CoV-2 public health emergency.  Safety protocols were in place, including screening questions prior to the visit, additional usage of staff PPE, and extensive cleaning of exam room while observing appropriate contact time as indicated for disinfecting solutions.   Mitchell Donovan is a 65 year old male with a history of type 2 diabetes, GERD, hypertension, hyperlipdiemia who presents today for follow up of diabetes and urinary stream.  1) Type 2 Diabetes: Current medications include: Sitagliptan-metformin 50-1000 XR BID, Basaglar 40 units HS, Jardiance 25 mg daily.  He is checking his blood glucose 1 times daily and is getting readings of:  AM fasting: 120's Highest reading in the last month: 2 hours after a meal 160 Lowest reading: 70  Last A1C: 7.4 in July 2021, 7.2 today Last Eye Exam: UTD Last Foot Exam: Due Pneumonia Vaccination: UTD ACE/ARB: olmesartan  Statin: Lipitor   BP Readings from Last 3 Encounters:  05/09/20 122/72  03/28/20 140/74  11/04/19 132/78   2) Weak Urinary Stream: Chronic, mostly occurring every night, no significant difficulty during the day. Flomax was effective but caused symptoms of nasal congestion. Finasteride was ineffective, he only took for one month.   Review of Systems  Constitutional: Negative for unexpected weight change.  Respiratory: Positive for shortness of breath.        Shortness of breath in the morning with bloating.   Genitourinary: Positive for difficulty urinating.       Chronic weak urinary stream  Allergic/Immunologic: Negative for environmental allergies.  Neurological: Negative for dizziness.       Numbness to the plantar feet in the morning       Past Medical History:  Diagnosis Date  . Essential hypertension   . Hyperlipidemia   . Hypothyroidism   . Migraines   . Overactive  bladder   . Skin cancer   . Type 2 diabetes mellitus ([place])      Social History   Socioeconomic History  . Marital status: Married    Spouse name: Not on file  . Number of children: 1  . Years of education: Not on file  . Highest education level: Not on file  Occupational History  . Occupation: Chief Financial Officer  Tobacco Use  . Smoking status: Former Smoker    Types: Cigarettes, Cigars  . Smokeless tobacco: Never Used  . Tobacco comment: in college, still occasionaly smoke cigars  Vaping Use  . Vaping Use: Never used  Substance and Sexual Activity  . Alcohol use: Yes    Comment: 2-3 per day  . Drug use: Never  . Sexual activity: Not on file  Other Topics Concern  . Not on file  Social History Narrative   Married.   1 child.   Works as an Chief Financial Officer for [employer].   Enjoys golfing, fishing, traveling.    Social Determinants of Health   Financial Resource Strain: Not on file  Food Insecurity: Not on file  Transportation Needs: Not on file  Physical Activity: Not on file  Stress: Not on file  Social Connections: Not on file  Intimate Partner Violence: Not on file    Past Surgical History:  Procedure Laterality Date  . KNEE SURGERY Left 1988  . LASIK  2008  . MOHS SURGERY  2006   Nose    Family History  Problem Relation Age of  Onset  . Heart disease Mother   . COPD Mother   . Diabetes Mother   . Heart disease Father   . Hyperlipidemia Father   . Diabetes Sister     Allergies  Allergen Reactions  . Sporanos [Itraconazole] Swelling    Current Outpatient Medications on File Prior to Visit  Medication Sig Dispense Refill  . aspirin EC 81 MG tablet Take 81 mg by mouth daily. Swallow whole.    Marland Kitchen atorvastatin (LIPITOR) 20 MG tablet TAKE 1 TABLET DAILY FOR    CHOLESTEROL 90 tablet 1  . empagliflozin (JARDIANCE) 25 MG TABS tablet TAKE 1 TABLET DAILY FOR    DIABETES 90 tablet 3  . Insulin Glargine (BASAGLAR KWIKPEN) 100 UNIT/ML INJECT 40 UNITS            SUBCUTANEOUSLY  AT BEDTIME 45 mL 2  . Insulin Pen Needle (BD PEN NEEDLE NANO 2ND GEN) 32G X 4 MM MISC USE DAILY WITH INSULIN 100 each 3  . levothyroxine (SYNTHROID) 75 MCG tablet TAKE 1 TABLET EVERY MORNING ON AN EMPTY STOMACH WITH WATER, NO FOOD OR OTHER MEDICATIONS FOR 30 MINUTES. 90 tablet 1  . olmesartan (BENICAR) 20 MG tablet TAKE 1 TABLET DAILY FOR    BLOOD PRESSURE 90 tablet 3  . ONETOUCH ULTRA test strip Use as instructed to test blood sugar up to 4 times daily 300 each 3  . SitaGLIPtin-MetFORMIN HCl (JANUMET XR) 50-1000 MG TB24 Take 1 tablet by mouth twice daily for diabetes. 180 tablet 3   No current facility-administered medications on file prior to visit.    BP 122/72 (BP Location: Left Arm, Patient Position: Sitting, Cuff Size: Large)   Pulse 80   Temp 97.9 F (36.6 C) (Oral)   Ht 5\' 8"  (1.727 m)   Wt 198 lb (89.8 kg)   SpO2 97%   BMI 30.11 kg/m    Objective:   Physical Exam Constitutional:      Appearance: He is well-nourished.  Cardiovascular:     Rate and Rhythm: Normal rate and regular rhythm.  Pulmonary:     Effort: Pulmonary effort is normal.     Breath sounds: Normal breath sounds.  Musculoskeletal:     Cervical back: Neck supple.  Skin:    General: Skin is warm and dry.  Psychiatric:        Mood and Affect: Mood and affect normal.            Assessment & Plan:

## 2020-05-09 NOTE — Patient Instructions (Addendum)
Start tadalafil (Cilias) 5 mg once daily for urinary symptoms.  Continue to work on a healthy diet. Ensure you are consuming 64 ounces of water daily.  Continue exercising. You should be getting 150 minutes of moderate intensity exercise weekly.  Please update me regarding your urinary stream.  Please schedule a follow up appointment in 6 months for your physical.   It was a pleasure to see you today!

## 2020-05-10 ENCOUNTER — Encounter: Payer: Self-pay | Admitting: Cardiovascular Disease

## 2020-05-10 ENCOUNTER — Other Ambulatory Visit: Payer: Self-pay

## 2020-05-10 ENCOUNTER — Ambulatory Visit: Payer: Commercial Managed Care - PPO | Admitting: Cardiovascular Disease

## 2020-05-10 VITALS — BP 144/90 | HR 78 | Ht 68.0 in | Wt 188.0 lb

## 2020-05-10 DIAGNOSIS — Z794 Long term (current) use of insulin: Secondary | ICD-10-CM

## 2020-05-10 DIAGNOSIS — E118 Type 2 diabetes mellitus with unspecified complications: Secondary | ICD-10-CM | POA: Diagnosis not present

## 2020-05-10 DIAGNOSIS — K219 Gastro-esophageal reflux disease without esophagitis: Secondary | ICD-10-CM | POA: Diagnosis not present

## 2020-05-10 DIAGNOSIS — R079 Chest pain, unspecified: Secondary | ICD-10-CM | POA: Diagnosis not present

## 2020-05-10 DIAGNOSIS — E782 Mixed hyperlipidemia: Secondary | ICD-10-CM

## 2020-05-10 DIAGNOSIS — R0602 Shortness of breath: Secondary | ICD-10-CM

## 2020-05-10 NOTE — Progress Notes (Signed)
Cardiology Office Note  Date:  05/10/2020   ID:  Mitchell Donovan, DOB 23-Jan-1956, MRN [id number]  PCP:  Pleas Koch, NP   Chief Complaint  Patient presents with  . New Patient (Initial Visit)    New patient to establish care with provider for family hx of heart disease. Per patient when he has gas or indigestion in his stomach it become harder for him to breathe. Medications verbally reviewed with patient.     HPI:  Mitchell Donovan is a 65 year old gentleman with past medical history of Hypertension Diabetes type 2, on insulin Hyperlipidemia Who presents by referral from Alma Friendly for consultation of his shortness of breath, chest fullness  Recently moved from [place] in 2019 Medical records requested Around that time was walking to go fly fishing, felt a fullness, short of breath Appreciated a sloshing noise in his chest and abdomen, felt like an air bubble Symptoms resolved by belching, burping  Reports having periodic symptoms similar to above with a fullness like an air bubble relieved with belching Typically presents in the morning after eating breakfast and goes away after 2 hours  Never has an episode after 10 AM No symptoms with exertion  Also has rare episodes where food seems to get stuck  Lab work reviewed Hemoglobin A1c 7.2  Prior cardiac studies reviewed Echocardiogram June 2018 Outside facility, essentially normal study with ejection fraction greater than 60%  Has had 2 stress test in [place], years ago Remote hx of near syncope, was dehydrated Stress test no ischemia  EKG personally reviewed by myself on todays visit Normal sinus rhythm rate 78 bpm no significant ST-T wave changes  PMH:   has a past medical history of Essential hypertension, Hyperlipidemia, Hypothyroidism, Migraines, Overactive bladder, Skin cancer, and Type 2 diabetes mellitus ([place]).  PSH:    Past Surgical History:  Procedure Laterality Date  . KNEE SURGERY Left  1988  . LASIK  2008  . MOHS SURGERY  2006   Nose    Current Outpatient Medications  Medication Sig Dispense Refill  . aspirin EC 81 MG tablet Take 81 mg by mouth daily. Swallow whole.    Marland Kitchen atorvastatin (LIPITOR) 20 MG tablet TAKE 1 TABLET DAILY FOR    CHOLESTEROL 90 tablet 1  . empagliflozin (JARDIANCE) 25 MG TABS tablet TAKE 1 TABLET DAILY FOR    DIABETES 90 tablet 3  . Insulin Glargine (BASAGLAR KWIKPEN) 100 UNIT/ML INJECT 40 UNITS            SUBCUTANEOUSLY AT BEDTIME 45 mL 2  . Insulin Pen Needle (BD PEN NEEDLE NANO 2ND GEN) 32G X 4 MM MISC USE DAILY WITH INSULIN 100 each 3  . levothyroxine (SYNTHROID) 75 MCG tablet TAKE 1 TABLET EVERY MORNING ON AN EMPTY STOMACH WITH WATER, NO FOOD OR OTHER MEDICATIONS FOR 30 MINUTES. 90 tablet 1  . olmesartan (BENICAR) 20 MG tablet TAKE 1 TABLET DAILY FOR    BLOOD PRESSURE 90 tablet 3  . ONETOUCH ULTRA test strip Use as instructed to test blood sugar up to 4 times daily 300 each 3  . SitaGLIPtin-MetFORMIN HCl (JANUMET XR) 50-1000 MG TB24 Take 1 tablet by mouth twice daily for diabetes. 180 tablet 3  . tadalafil (CIALIS) 5 MG tablet Take 1 tablet (5 mg total) by mouth daily. For urinary stream. 30 tablet 0   No current facility-administered medications for this visit.     Allergies:   Sporanos [itraconazole]   Social History:  The patient  reports  that he has quit smoking. His smoking use included cigarettes and cigars. He has never used smokeless tobacco. He reports current alcohol use. He reports that he does not use drugs.   Family History:   family history includes COPD in his mother; Diabetes in his mother and sister; Heart disease in his father and mother; Hyperlipidemia in his father.    Review of Systems: Review of Systems  Constitutional: Negative.   HENT: Negative.   Respiratory: Positive for shortness of breath.   Cardiovascular: Negative.   Gastrointestinal: Negative.   Musculoskeletal: Negative.   Neurological: Negative.    Psychiatric/Behavioral: Negative.   All other systems reviewed and are negative.   PHYSICAL EXAM: VS:  BP (!) 144/90 (BP Location: Left Arm, Patient Position: Sitting, Cuff Size: Normal)   Pulse 78   Ht 5\' 8"  (1.727 m)   Wt 188 lb (85.3 kg)   SpO2 98%   BMI 28.59 kg/m  , BMI Body mass index is 28.59 kg/m. GEN: Well nourished, well developed, in no acute distress HEENT: normal Neck: no JVD, carotid bruits, or masses Cardiac: RRR; no murmurs, rubs, or gallops,no edema  Respiratory:  clear to auscultation bilaterally, normal work of breathing GI: soft, nontender, nondistended, + BS MS: no deformity or atrophy Skin: warm and dry, no rash Neuro:  Strength and sensation are intact Psych: euthymic mood, full affect   Recent Labs: 10/30/2019: ALT 22; BUN 22; Creatinine, Ser 1.00; Hemoglobin 16.5; Platelets 256.0; Potassium 4.5; Sodium 141; TSH 2.00    Lipid Panel Lab Results  Component Value Date   CHOL 120 10/30/2019   HDL 41.80 10/30/2019   LDLCALC 42 10/30/2019   TRIG 182.0 (H) 10/30/2019      Wt Readings from Last 3 Encounters:  05/10/20 188 lb (85.3 kg)  05/09/20 198 lb (89.8 kg)  03/28/20 184 lb 8 oz (83.7 kg)      ASSESSMENT AND PLAN:  Problem List Items Addressed This Visit      Cardiology Problems   Hyperlipidemia     Other   GERD (gastroesophageal reflux disease)    Other Visit Diagnoses    Chest pain of uncertain etiology    -  Primary   Relevant Orders   EKG 12-Lead   CT CARDIAC SCORING (SELF PAY ONLY)   Shortness of breath       Relevant Orders   EKG 12-Lead   CT CARDIAC SCORING (SELF PAY ONLY)   Type 2 diabetes mellitus with complication, with long-term current use of insulin (HCC)         Preop EGD colonoscopy No anginal symptoms, no further testing needed Good exercise tolerance  Shortness of breath Has rare episodes Has GI symptoms after eating breakfast feels gassy, short of breath, Symptoms relieved with belching Typically  presents after eating breakfast, never after 10 AM, never with exertion. Suspect may have hiatal hernia At baseline/with exertion with no symptoms, symptom is closely associated with breakfast sparingly  Diabetes type 2  well-controlled Long history of insulin use, 30 years Managed by primary care  Preventive care Prior records reviewed including stress testing, echocardiography from outside cardiologist We have ordered CT coronary calcium scoring to guide diabetes management Cholesterol is at goal [place] visualize any GI pathology   Total encounter time more than 60 minutes  Greater than 50% was spent in counseling and coordination of care with the patient  Patient was seen by consultation for Alma Friendly and will be referred back to her office for ongoing  care of the issues detailed above  Signed, Esmond Plants, M.D., Ph.D. [place], [place]

## 2020-05-10 NOTE — Patient Instructions (Addendum)
  Medication Instructions:  No changes  If you need a refill on your cardiac medications before your next appointment, please call your pharmacy.    Lab work: No new labs needed   If you have labs (blood work) drawn today and your tests are completely normal, you will receive your results only by: Marland Kitchen MyChart Message (if you have MyChart) OR . A paper copy in the mail If you have any lab test that is abnormal or we need to change your treatment, we will call you to review the results.   Testing/Procedures: CT coronary calcium scoring, for Chest pain, shortness of breath We will order CT coronary calcium score $99 out of pocket expense  at our [facility] in [place]  This procedure uses special x-ray equipment to produce pictures of the coronary arteries to determine if they are blocked or narrowed by the buildup of plaque - an indicator for atherosclerosis or coronary artery disease (CAD).  Please call [phone] to schedule at your earliest convince   [facility], [place] [address]    Follow-Up: At [facility], you and your health needs are our priority.  As part of our continuing mission to provide you with exceptional heart care, we have created designated Provider Care Teams.  These Care Teams include your primary Cardiologist (physician) and Advanced Practice Providers (APPs -  Physician Assistants and Nurse Practitioners) who all work together to provide you with the care you need, when you need it.  . You will need a follow up appointment as needed  . Providers on your designated Care Team:   . Murray Hodgkins, NP . Christell Faith, PA-C . Marrianne Mood, PA-C  Any Other Special Instructions Will Be Listed Below (If Applicable).  COVID-19 Vaccine Information can be found at: ShippingScam.co.uk For questions related to vaccine distribution or  appointments, please email vaccine@[place] .com or call [phone].

## 2020-05-16 NOTE — Progress Notes (Signed)
Mitchell Donovan, cardiology cleared patient for EGD and colonoscopy. Please schedule patient for EGD and colonoscopy with Dr. Havery Moros. Refer to last office visit for orders. Thx.

## 2020-05-17 NOTE — Progress Notes (Signed)
LMOM for patient to call back.

## 2020-05-20 NOTE — Progress Notes (Signed)
Unable to reach patient by phone to discuss scheduling EGD/Colonoscopy with Dr Havery Moros. Have made several attempts by phone and left messages each time to contact our office to schedule. A letter has been mailed to patient regarding this matter.

## 2020-05-25 ENCOUNTER — Other Ambulatory Visit: Payer: Self-pay

## 2020-05-25 ENCOUNTER — Ambulatory Visit
Admission: RE | Admit: 2020-05-25 | Discharge: 2020-05-25 | Disposition: A | Discharge status: Home / Self Care | Payer: Commercial Managed Care - PPO | Source: Ambulatory Visit | Attending: Cardiovascular Disease | Admitting: Cardiovascular Disease

## 2020-05-25 DIAGNOSIS — R079 Chest pain, unspecified: Secondary | ICD-10-CM | POA: Insufficient documentation

## 2020-05-25 DIAGNOSIS — R0602 Shortness of breath: Secondary | ICD-10-CM | POA: Insufficient documentation

## 2020-05-30 ENCOUNTER — Telehealth: Payer: Self-pay

## 2020-05-30 DIAGNOSIS — R3912 Poor urinary stream: Secondary | ICD-10-CM

## 2020-05-30 MED ORDER — FINASTERIDE 5 MG PO TABS: 5.0000 mg | ORAL_TABLET | Freq: Every day | ORAL | 1 refills | Status: DC

## 2020-05-30 NOTE — Telephone Encounter (Signed)
Able to reach pt regarding his recent  CT coronary calcium score, Dr. Rockey Situ had a chance to review his results and advised  "Minimal coronary calcification,  Score of 17, low  Good news" Pt delighted of reports, nothing further at this time.

## 2020-06-06 ENCOUNTER — Telehealth: Payer: Self-pay | Admitting: Nurse Practitioner

## 2020-06-06 NOTE — Telephone Encounter (Signed)
Spoke to patient who saw Colleen on 03/28/21 for SOB, belching. He was advised to contact cardiology prior to scheduling his EGD/colon. Patient saw Dr Rockey Situ on 05/10/20 and had CT cardiac scoring on 05/25/20. Patient has been cleared for procedures. See Office visit. Patient is requesting a morning appointment for his EGD/Colonoscopy. He will contact the office in a few weeks once Dr Doyne Keel May schedule opens with morning appointments. All questions answered. Patient voiced understanding.

## 2020-06-06 NOTE — Telephone Encounter (Signed)
Inbound call from patient requesting a call back from a nurse please in regards to scheduling procedures.  He did go to PCP and wants to know if he can go ahead and schedule now.  Please advise.

## 2020-06-21 LAB — HM DIABETES EYE EXAM

## 2020-08-08 ENCOUNTER — Other Ambulatory Visit: Payer: Self-pay | Admitting: Primary Care

## 2020-08-08 DIAGNOSIS — Z794 Long term (current) use of insulin: Secondary | ICD-10-CM

## 2020-08-08 DIAGNOSIS — E119 Type 2 diabetes mellitus without complications: Secondary | ICD-10-CM

## 2020-08-08 DIAGNOSIS — E039 Hypothyroidism, unspecified: Secondary | ICD-10-CM

## 2020-08-08 DIAGNOSIS — E785 Hyperlipidemia, unspecified: Secondary | ICD-10-CM

## 2020-08-17 ENCOUNTER — Ambulatory Visit (AMBULATORY_SURGERY_CENTER): Payer: Self-pay

## 2020-08-17 ENCOUNTER — Other Ambulatory Visit: Payer: Self-pay

## 2020-08-17 VITALS — Ht 68.0 in | Wt 183.0 lb

## 2020-08-17 DIAGNOSIS — R0602 Shortness of breath: Secondary | ICD-10-CM

## 2020-08-17 DIAGNOSIS — Z1211 Encounter for screening for malignant neoplasm of colon: Secondary | ICD-10-CM

## 2020-08-17 DIAGNOSIS — R142 Eructation: Secondary | ICD-10-CM

## 2020-08-17 NOTE — Progress Notes (Signed)
No egg or soy allergy known to patient  No issues with past sedation with any surgeries or procedures Patient denies ever being told they had issues or difficulty with intubation  No FH of Malignant Hyperthermia No diet pills per patient No home 02 use per patient  No blood thinners per patient  Pt denies issues with constipation  No A fib or A flutter  EMMI video via MyChart  COVID 19 guidelines implemented in [place] today with Pt and RN  Discussed with pt there will be an out-of-pocket cost for prep and that varies from $0 to 70 dollars  Due to the COVID-19 pandemic we are asking patients to follow certain guidelines.  Pt aware of COVID protocols and [facility] guidelines   

## 2020-08-31 ENCOUNTER — Encounter: Payer: Self-pay | Admitting: Gastroenterology

## 2020-08-31 ENCOUNTER — Other Ambulatory Visit: Payer: Self-pay

## 2020-08-31 ENCOUNTER — Ambulatory Visit (AMBULATORY_SURGERY_CENTER): Payer: Commercial Managed Care - PPO | Admitting: Gastroenterology

## 2020-08-31 VITALS — BP 120/81 | HR 66 | Temp 98.0°F | Resp 15 | Ht 68.0 in | Wt 183.0 lb

## 2020-08-31 DIAGNOSIS — D127 Benign neoplasm of rectosigmoid junction: Secondary | ICD-10-CM

## 2020-08-31 DIAGNOSIS — K222 Esophageal obstruction: Secondary | ICD-10-CM | POA: Diagnosis not present

## 2020-08-31 DIAGNOSIS — R0789 Other chest pain: Secondary | ICD-10-CM | POA: Diagnosis not present

## 2020-08-31 DIAGNOSIS — K635 Polyp of colon: Secondary | ICD-10-CM

## 2020-08-31 DIAGNOSIS — R1310 Dysphagia, unspecified: Secondary | ICD-10-CM

## 2020-08-31 DIAGNOSIS — K2980 Duodenitis without bleeding: Secondary | ICD-10-CM

## 2020-08-31 DIAGNOSIS — K2950 Unspecified chronic gastritis without bleeding: Secondary | ICD-10-CM

## 2020-08-31 DIAGNOSIS — Z1211 Encounter for screening for malignant neoplasm of colon: Secondary | ICD-10-CM

## 2020-08-31 MED ORDER — SODIUM CHLORIDE 0.9 % IV SOLN: 500.0000 mL | Freq: Once | INTRAVENOUS | Status: DC

## 2020-08-31 MED ORDER — OMEPRAZOLE 20 MG PO CPDR: 20.0000 mg | DELAYED_RELEASE_CAPSULE | Freq: Every day | ORAL | 0 refills | Status: DC

## 2020-08-31 NOTE — Op Note (Signed)
[facility] Patient Name: Mitchell Donovan Procedure Date: 08/31/2020 8:04 AM MRN: [id number] Endoscopist: Remo Lipps P. Havery Moros , MD Age: 65 Referring MD:  Date of Birth: 1955-09-13 Gender: Male Account #: [id number] Procedure:                Colonoscopy Indications:              Screening for colorectal malignant neoplasm Medicines:                Monitored Anesthesia Care Procedure:                Pre-Anesthesia Assessment:                           - Prior to the procedure, a History and Physical                            was performed, and patient medications and                            allergies were reviewed. The patient's tolerance of                            previous anesthesia was also reviewed. The risks                            and benefits of the procedure and the sedation                            options and risks were discussed with the patient.                            All questions were answered, and informed consent                            was obtained. Prior Anticoagulants: The patient has                            taken no previous anticoagulant or antiplatelet                            agents. ASA Grade Assessment: II - A patient with                            mild systemic disease. After reviewing the risks                            and benefits, the patient was deemed in                            satisfactory condition to undergo the procedure.                           After obtaining informed consent, the colonoscope  was passed under direct vision. Throughout the                            procedure, the patient's blood pressure, pulse, and                            oxygen saturations were monitored continuously. The                            Olympus CF-HQ190 [id number]) Colonoscope was                            introduced through the anus and advanced to the the                            cecum,  identified by appendiceal orifice and                            ileocecal valve. The colonoscopy was performed                            without difficulty. The patient tolerated the                            procedure well. The quality of the bowel                            preparation was good. The ileocecal valve,                            appendiceal orifice, and rectum were photographed. Scope In: 8:29:53 AM Scope Out: 8:50:40 AM Scope Withdrawal Time: 0 hours 15 minutes 43 seconds  Total Procedure Duration: 0 hours 20 minutes 47 seconds  Findings:                 The perianal and digital rectal examinations were                            normal.                           Scattered medium-mouthed diverticula were found in                            the entire colon.                           A 3 mm polyp was found in the transverse colon. The                            polyp was sessile. The polyp was removed with a                            cold snare. Resection and retrieval were complete.  A 4 to 5 mm polyp was found in the recto-sigmoid                            colon. The polyp was sessile. The polyp was removed                            with a cold snare. Resection and retrieval were                            complete.                           Internal hemorrhoids were found during retroflexion.                           Anal papilla(e) were hypertrophied.                           The exam was otherwise without abnormality. Complications:            No immediate complications. Estimated blood loss:                            Minimal. Estimated Blood Loss:     Estimated blood loss was minimal. Impression:               - Diverticulosis in the entire examined colon.                           - One 3 mm polyp in the transverse colon, removed                            with a cold snare. Resected and retrieved.                           - One  4 to 5 mm polyp at the recto-sigmoid colon,                            removed with a cold snare. Resected and retrieved.                           - Internal hemorrhoids.                           - Anal papilla(e) were hypertrophied.                           - The examination was otherwise normal. Recommendation:           - Patient has a contact number available for                            emergencies. The signs and symptoms of potential                            delayed  complications were discussed with the                            patient. Return to normal activities tomorrow.                            Written discharge instructions were provided to the                            patient.                           - Resume previous diet.                           - Continue present medications.                           - Await pathology results. Viviann Spare P. Zayna Toste, MD 08/31/2020 8:55:53 AM This report has been signed electronically.

## 2020-08-31 NOTE — Op Note (Signed)
[place] Patient Name: Mitchell Donovan Procedure Date: 08/31/2020 8:04 AM MRN: [id number] Endoscopist: Remo Lipps P. Havery Moros , MD Age: 65 Referring MD:  Date of Birth: [birth year] Gender: Male Account #: [id number] Procedure:                Upper GI endoscopy Indications:              Dysphagia, Unexplained chest pain, upper abdominal                            fullness/ belching, history of periodic GERD Medicines:                Monitored Anesthesia Care Procedure:                Pre-Anesthesia Assessment:                           - Prior to the procedure, a History and Physical                            was performed, and patient medications and                            allergies were reviewed. The patient's tolerance of                            previous anesthesia was also reviewed. The risks                            and benefits of the procedure and the sedation                            options and risks were discussed with the patient.                            All questions were answered, and informed consent                            was obtained. Prior Anticoagulants: The patient has                            taken no previous anticoagulant or antiplatelet                            agents. ASA Grade Assessment: II - A patient with                            mild systemic disease. After reviewing the risks                            and benefits, the patient was deemed in                            satisfactory condition to undergo the procedure.  After obtaining informed consent, the endoscope was                            passed under direct vision. Throughout the                            procedure, the patient's blood pressure, pulse, and                            oxygen saturations were monitored continuously. The                            Endoscope was introduced through the mouth, and                             advanced to the second part of duodenum. The upper                            GI endoscopy was accomplished without difficulty.                            The patient tolerated the procedure well. Scope In: Scope Out: Findings:                 Esophagogastric landmarks were identified: the                            Z-line was found at 38 cm from the incisors.                           A 1-2 cm sliding hiatal hernia was present.                           One benign-appearing, intrinsic mild stenosis was                            found 38 cm from the incisors. This stenosis                            measured less than one cm (in length). A TTS                            dilator was passed through the scope. Dilation with                            a 16-17-18 mm balloon to 32mm was performed and no                            mucosal wrent noted. Then an 18-19-20 mm balloon                            dilator was performed to 18 mm, 19 mm and 20 mm  after which a small wrent was noted. Biopsy forcep                            was used to open the stricture further.                           The exam of the esophagus was otherwise normal.                           A few dispersed small erosions with no stigmata of                            recent bleeding were found in the gastric antrum.                           The exam of the stomach was otherwise normal.                           Biopsies were taken with a cold forceps in the                            gastric body, at the incisura and in the gastric                            antrum for Helicobacter pylori testing.                           Patchy mild inflammation characterized by erosions                            and erythema was found in the duodenal bulb and in                            the second portion of the duodenum. Biopsies were                            taken with a cold forceps for  histology.                           The exam of the duodenum was otherwise normal. Complications:            No immediate complications. Estimated blood loss:                            Minimal. Estimated Blood Loss:     Estimated blood loss was minimal. Impression:               - Esophagogastric landmarks identified.                           - 1-2 cm sliding hiatal hernia.                           - Benign-appearing esophageal stenosis. Dilated to  24mm with good result.                           - Mild gastric erosions with no stigmata of recent                            bleeding. Normal stomach otherwise - biopsies                            obtained                           - Mild duodenitis. Biopsied.                           Of note, I did not realize until after the                            procedure was completed that gastric and duodenal                            biopsies were placed in the same jar unfortunately. Recommendation:           - Patient has a contact number available for                            emergencies. The signs and symptoms of potential                            delayed complications were discussed with the                            patient. Return to normal activities tomorrow.                            Written discharge instructions were provided to the                            patient.                           - Resume previous diet.                           - Continue present medications.                           - Trial of omeprazole 20mg  / day for 1 month                           - Await pathology results and course post dilation Mitchell Donovan P. Mitchell Kinzler, MD 08/31/2020 9:02:32 AM This report has been signed electronically.

## 2020-08-31 NOTE — Progress Notes (Signed)
To pacu, VSS. Report to [email or web address] 

## 2020-08-31 NOTE — Patient Instructions (Signed)
Please read handouts provided. Continue present medications. Await pathology results. Trial of omeprazole 20 mg/day for 1 month. Resume previous diet.     YOU HAD AN ENDOSCOPIC PROCEDURE TODAY AT [facility]:   Refer to the procedure report that was given to you for any specific questions about what was found during the examination.  If the procedure report does not answer your questions, please call your gastroenterologist to clarify.  If you requested that your care partner not be given the details of your procedure findings, then the procedure report has been included in a sealed envelope for you to review at your convenience later.  YOU SHOULD EXPECT: Some feelings of bloating in the abdomen. Passage of more gas than usual.  Walking can help get rid of the air that was put into your GI tract during the procedure and reduce the bloating. If you had a lower endoscopy (such as a colonoscopy or flexible sigmoidoscopy) you may notice spotting of blood in your stool or on the toilet paper. If you underwent a bowel prep for your procedure, you may not have a normal bowel movement for a few days.  Please Note:  You might notice some irritation and congestion in your nose or some drainage.  This is from the oxygen used during your procedure.  There is no need for concern and it should clear up in a day or so.  SYMPTOMS TO REPORT IMMEDIATELY:   Following lower endoscopy (colonoscopy or flexible sigmoidoscopy):  Excessive amounts of blood in the stool  Significant tenderness or worsening of abdominal pains  Swelling of the abdomen that is new, acute  Fever of 100F or higher   Following upper endoscopy (EGD)  Vomiting of blood or coffee ground material  New chest pain or pain under the shoulder blades  Painful or persistently difficult swallowing  New shortness of breath  Fever of 100F or higher  Black, tarry-looking stools  For urgent or emergent issues, a  gastroenterologist can be reached at any hour by calling [phone]. Do not use MyChart messaging for urgent concerns.    DIET:  We do recommend a small meal at first, but then you may proceed to your regular diet.  Drink plenty of fluids but you should avoid alcoholic beverages for 24 hours.  ACTIVITY:  You should plan to take it easy for the rest of today and you should NOT DRIVE or use heavy machinery until tomorrow (because of the sedation medicines used during the test).    FOLLOW UP: Our staff will call the number listed on your records 48-72 hours following your procedure to check on you and address any questions or concerns that you may have regarding the information given to you following your procedure. If we do not reach you, we will leave a message.  We will attempt to reach you two times.  During this call, we will ask if you have developed any symptoms of COVID 19. If you develop any symptoms (ie: fever, flu-like symptoms, shortness of breath, cough etc.) before then, please call [phone].  If you test positive for Covid 19 in the 2 weeks post procedure, please call and report this information to [place].    If any biopsies were taken you will be contacted by phone or by letter within the next 1-3 weeks.  Please call us at [phone] if you have not heard about the biopsies in 3 weeks.    SIGNATURES/CONFIDENTIALITY: You and/or your care partner  have signed paperwork which will be entered into your electronic medical record.  These signatures attest to the fact that that the information above on your After Visit Summary has been reviewed and is understood.  Full responsibility of the confidentiality of this discharge information lies with you and/or your care-partner.

## 2020-08-31 NOTE — Progress Notes (Signed)
Pt's states no medical or surgical changes since previsit or office visit. 

## 2020-08-31 NOTE — Progress Notes (Signed)
Called to room to assist during endoscopic procedure.  Patient ID and intended procedure confirmed with present staff. Received instructions for my participation in the procedure from the performing physician.  

## 2020-09-02 ENCOUNTER — Telehealth: Payer: Self-pay | Admitting: *Deleted

## 2020-09-02 NOTE — Telephone Encounter (Signed)
Follow up call made. 

## 2020-09-02 NOTE — Telephone Encounter (Signed)
  Follow up Call-  Call back number 08/31/2020  Post procedure Call Back phone  # [phone]  Permission to leave phone message Yes  Some recent data might be hidden     No answer at 2nd attempt follow up phone call.  Left message on voicemail.

## 2020-09-26 ENCOUNTER — Other Ambulatory Visit: Payer: Self-pay | Admitting: Primary Care

## 2020-09-26 DIAGNOSIS — E119 Type 2 diabetes mellitus without complications: Secondary | ICD-10-CM

## 2020-10-10 ENCOUNTER — Other Ambulatory Visit: Payer: Self-pay | Admitting: Primary Care

## 2020-10-10 DIAGNOSIS — I10 Essential (primary) hypertension: Secondary | ICD-10-CM

## 2020-10-31 ENCOUNTER — Other Ambulatory Visit: Payer: Self-pay | Admitting: Primary Care

## 2020-10-31 DIAGNOSIS — E785 Hyperlipidemia, unspecified: Secondary | ICD-10-CM

## 2020-10-31 DIAGNOSIS — E039 Hypothyroidism, unspecified: Secondary | ICD-10-CM

## 2020-11-08 ENCOUNTER — Encounter: Payer: Self-pay | Admitting: Primary Care

## 2020-11-08 ENCOUNTER — Ambulatory Visit (INDEPENDENT_AMBULATORY_CARE_PROVIDER_SITE_OTHER): Payer: Commercial Managed Care - PPO | Admitting: Primary Care

## 2020-11-08 ENCOUNTER — Other Ambulatory Visit: Payer: Self-pay

## 2020-11-08 VITALS — BP 130/80 | HR 91 | Temp 97.1°F | Ht 68.0 in | Wt 179.0 lb

## 2020-11-08 DIAGNOSIS — Z794 Long term (current) use of insulin: Secondary | ICD-10-CM

## 2020-11-08 DIAGNOSIS — Z0001 Encounter for general adult medical examination with abnormal findings: Secondary | ICD-10-CM | POA: Diagnosis not present

## 2020-11-08 DIAGNOSIS — R3912 Poor urinary stream: Secondary | ICD-10-CM

## 2020-11-08 DIAGNOSIS — I10 Essential (primary) hypertension: Secondary | ICD-10-CM

## 2020-11-08 DIAGNOSIS — K219 Gastro-esophageal reflux disease without esophagitis: Secondary | ICD-10-CM

## 2020-11-08 DIAGNOSIS — E039 Hypothyroidism, unspecified: Secondary | ICD-10-CM | POA: Diagnosis not present

## 2020-11-08 DIAGNOSIS — Z125 Encounter for screening for malignant neoplasm of prostate: Secondary | ICD-10-CM

## 2020-11-08 DIAGNOSIS — R3915 Urgency of urination: Secondary | ICD-10-CM

## 2020-11-08 DIAGNOSIS — E785 Hyperlipidemia, unspecified: Secondary | ICD-10-CM

## 2020-11-08 DIAGNOSIS — E119 Type 2 diabetes mellitus without complications: Secondary | ICD-10-CM

## 2020-11-08 LAB — LDL CHOLESTEROL, DIRECT: Direct LDL: 67 mg/dL

## 2020-11-08 LAB — COMPREHENSIVE METABOLIC PANEL
ALT: 24 U/L (ref 0–53)
AST: 15 U/L (ref 0–37)
Albumin: 4.3 g/dL (ref 3.5–5.2)
Alkaline Phosphatase: 83 U/L (ref 39–117)
BUN: 22 mg/dL (ref 6–23)
CO2: 28 mEq/L (ref 19–32)
Calcium: 9.1 mg/dL (ref 8.4–10.5)
Chloride: 105 mEq/L (ref 96–112)
Creatinine, Ser: 0.92 mg/dL (ref 0.40–1.50)
GFR: 89.05 mL/min (ref >60.00)
Glucose, Bld: 151 mg/dL — ABNORMAL HIGH (ref 70–99)
Potassium: 4.6 mEq/L (ref 3.5–5.1)
Sodium: 141 mEq/L (ref 135–145)
Total Bilirubin: 0.8 mg/dL (ref 0.2–1.2)
Total Protein: 6.6 g/dL (ref 6.0–8.3)

## 2020-11-08 LAB — LIPID PANEL
Cholesterol: 139 mg/dL (ref 0–200)
HDL: 43.7 mg/dL (ref >39.00)
NonHDL: 95.74
Total CHOL/HDL Ratio: 3
Triglycerides: 208 mg/dL — ABNORMAL HIGH (ref 0.0–149.0)
VLDL: 41.6 mg/dL — ABNORMAL HIGH (ref 0.0–40.0)

## 2020-11-08 LAB — HEMOGLOBIN A1C: Hgb A1c MFr Bld: 8 % — ABNORMAL HIGH (ref 4.6–6.5)

## 2020-11-08 LAB — PSA: PSA: 0.52 ng/mL (ref 0.10–4.00)

## 2020-11-08 LAB — TSH: TSH: 1.33 u[IU]/mL (ref 0.35–5.50)

## 2020-11-08 MED ORDER — LEVOTHYROXINE SODIUM 75 MCG PO TABS: ORAL_TABLET | ORAL | 2 refills | Status: DC

## 2020-11-08 MED ORDER — ONETOUCH ULTRA VI STRP: ORAL_STRIP | 3 refills | Status: DC

## 2020-11-08 MED ORDER — ATORVASTATIN CALCIUM 20 MG PO TABS: 20.0000 mg | ORAL_TABLET | Freq: Every day | ORAL | 0 refills | Status: DC

## 2020-11-08 MED ORDER — OLMESARTAN MEDOXOMIL 20 MG PO TABS: 20.0000 mg | ORAL_TABLET | Freq: Every day | ORAL | 2 refills | Status: DC

## 2020-11-08 MED ORDER — EMPAGLIFLOZIN 25 MG PO TABS: ORAL_TABLET | ORAL | 3 refills | Status: DC

## 2020-11-08 MED ORDER — MIRABEGRON ER 25 MG PO TB24: 25.0000 mg | ORAL_TABLET | Freq: Every day | ORAL | 0 refills | Status: DC

## 2020-11-08 NOTE — Progress Notes (Signed)
Subjective:    Patient ID: Mitchell Donovan, male    DOB: 1955/05/22, 65 y.o.   MRN: [id number]  HPI  Mitchell Donovan is a very pleasant 65 y.o. male who presents today for complete physical and follow up of chronic conditions.  He continues to notice a weak urinary stream despite use of finasteride 5 mg. He has also noticed urinary urgency and leakage since his last visit. He did well on tamsulosin previously but this caused side effects of "stuffy nose". He denies hematuria. He is interested in other treatment.   He is checking his blood sugars once daily in the morning fasting which have been running in the 120's-150's. He is injecting Basalgar 40 units daily, is also compliant to his Jardiance 25 mg and Janumet XR BID.   Immunizations: -Tetanus: 2015 -Influenza: Due this season  -Covid-19: 3 vaccines -Shingles: Shingrix -Pneumonia: 2020   Diet: Fair diet.  Exercise: He is active with golfing, fishing, stretching.   Eye exam: Completes annually  Dental exam: Completes semi-annually   Colonoscopy: Completed in 2022, due in 2029 PSA: Due  BP Readings from Last 3 Encounters:  11/08/20 130/80  08/31/20 120/81  05/10/20 (!) 144/90      Review of Systems  Constitutional:  Negative for unexpected weight change.  HENT:  Negative for rhinorrhea.   Respiratory:  Negative for shortness of breath.   Cardiovascular:  Negative for chest pain.  Gastrointestinal:  Negative for constipation and diarrhea.  Genitourinary:  Positive for frequency and urgency. Negative for difficulty urinating and hematuria.       See HPI  Musculoskeletal:  Negative for arthralgias and myalgias.  Skin:  Negative for rash.  Allergic/Immunologic: Positive for environmental allergies.  Neurological:  Negative for dizziness and headaches.  Psychiatric/Behavioral:  The patient is not nervous/anxious.         Past Medical History:  Diagnosis Date   Essential hypertension    on meds   GERD  (gastroesophageal reflux disease)    with certain foods/OTC PRN meds   Hyperlipidemia    on meds   Hypothyroidism    on meds   Migraines    Overactive bladder    Skin cancer    Type 2 diabetes mellitus ([place])    on meds    Social History   Socioeconomic History   Marital status: Married    Spouse name: Not on file   Number of children: 1   Years of education: Not on file   Highest education level: Not on file  Occupational History   Occupation: Chief Financial Officer  Tobacco Use   Smoking status: Former    Types: Cigarettes, Cigars   Smokeless tobacco: Never   Tobacco comments:    in college, still occasionaly smoke cigars  Vaping Use   Vaping Use: Never used  Substance and Sexual Activity   Alcohol use: Yes    Alcohol/week: 12.0 standard drinks    Types: 12 Standard drinks or equivalent per week   Drug use: Never   Sexual activity: Not on file  Other Topics Concern   Not on file  Social History Narrative   Married.   1 child.   Works as an Chief Financial Officer for [employer].   Enjoys golfing, fishing, traveling.    Social Determinants of Health   Financial Resource Strain: Not on file  Food Insecurity: Not on file  Transportation Needs: Not on file  Physical Activity: Not on file  Stress: Not on file  Social Connections: Not on  file  Intimate Partner Violence: Not on file    Past Surgical History:  Procedure Laterality Date   COLONOSCOPY  2011   in [place]     in office   KNEE SURGERY Left 1988   LASIK  2008   MOHS SURGERY  2006   Nose   WISDOM TOOTH EXTRACTION      Family History  Problem Relation Age of Onset   Heart disease Mother    COPD Mother    Diabetes Mother    Heart disease Father    Hyperlipidemia Father    Diabetes Sister    Stomach cancer Neg Hx    Rectal cancer Neg Hx    Esophageal cancer Neg Hx    Colon cancer Neg Hx    Colon polyps Neg Hx     Allergies  Allergen Reactions   Sporanos [Itraconazole] Swelling     Current Outpatient Medications on File Prior to Visit  Medication Sig Dispense Refill   aspirin EC 81 MG tablet Take 81 mg by mouth daily. Swallow whole.     atorvastatin (LIPITOR) 20 MG tablet TAKE 1 TABLET DAILY FOR    CHOLESTEROL 90 tablet 0   empagliflozin (JARDIANCE) 25 MG TABS tablet TAKE 1 TABLET DAILY FOR    DIABETES 90 tablet 3   Insulin Glargine (BASAGLAR KWIKPEN) 100 UNIT/ML Inject 40 Units into the skin at bedtime. (Patient taking differently: Inject 40 Units into the skin in the morning.) 45 mL 1   Insulin Pen Needle (BD PEN NEEDLE NANO 2ND GEN) 32G X 4 MM MISC USE DAILY WITH INSULIN 100 each 3   levothyroxine (SYNTHROID) 75 MCG tablet TAKE 1 TABLET EVERY MORNING ON AN EMPTY STOMACH WITH WATER, NO FOOD OR OTHER MEDICATIONS FOR 30 MINUTES 90 tablet 0   olmesartan (BENICAR) 20 MG tablet TAKE 1 TABLET DAILY FOR    BLOOD PRESSURE 90 tablet 0   omeprazole (PRILOSEC) 20 MG capsule Take 1 capsule (20 mg total) by mouth daily. 45 capsule 0   ONETOUCH ULTRA test strip Use as instructed to test blood sugar up to 4 times daily 300 each 3   SitaGLIPtin-MetFORMIN HCl (JANUMET XR) 50-1000 MG TB24 TAKE 1 TABLET TWICE A DAY  FOR DIABETES 180 tablet 3   No current facility-administered medications on file prior to visit.    BP 130/80   Pulse 91   Temp (!) 97.1 F (36.2 C) (Temporal)   Ht '5\' 8"'$  (1.727 m)   Wt 179 lb (81.2 kg)   SpO2 98%   BMI 27.22 kg/m  Objective:   Physical Exam HENT:     Right Ear: Tympanic membrane and ear canal normal.     Left Ear: Tympanic membrane and ear canal normal.     Nose: Nose normal.     Right Sinus: No maxillary sinus tenderness or frontal sinus tenderness.     Left Sinus: No maxillary sinus tenderness or frontal sinus tenderness.  Eyes:     Conjunctiva/sclera: Conjunctivae normal.  Neck:     Thyroid: No thyromegaly.     Vascular: No carotid bruit.  Cardiovascular:     Rate and Rhythm: Normal rate and regular rhythm.     Heart sounds: Normal  heart sounds.  Pulmonary:     Effort: Pulmonary effort is normal.     Breath sounds: Normal breath sounds. No wheezing or rales.  Abdominal:     General: Bowel sounds are normal.     Palpations: Abdomen  is soft.     Tenderness: There is no abdominal tenderness.  Musculoskeletal:        General: Normal range of motion.     Cervical back: Neck supple.  Skin:    General: Skin is warm and dry.  Neurological:     Mental Status: He is alert and oriented to person, place, and time.     Cranial Nerves: No cranial nerve deficit.     Deep Tendon Reflexes: Reflexes are normal and symmetric.  Psychiatric:        Mood and Affect: Mood normal.          Assessment & Plan:      This visit occurred during the SARS-CoV-2 public health emergency.  Safety protocols were in place, including screening questions prior to the visit, additional usage of staff PPE, and extensive cleaning of exam room while observing appropriate contact time as indicated for disinfecting solutions.

## 2020-11-08 NOTE — Assessment & Plan Note (Signed)
No improvement with finasteride 5 mg, now with additional symptoms of urgency. Discussed that jardiance may be contributing to symptoms.  He did very well with tamsulosin but couldn't tolerate his side effect. Will switch to Myrbetric 25 mg daily. He will update.

## 2020-11-08 NOTE — Assessment & Plan Note (Signed)
Immunizations UTD. PSA due and pending. Colonoscopy UTD, due in 2029.  Discussed the importance of a healthy diet and regular exercise in order for weight loss, and to reduce the risk of further co-morbidity.  Exam today stable. Labs pending.

## 2020-11-08 NOTE — Patient Instructions (Addendum)
Stop by the lab prior to leaving today. I will notify you of your results once received.   Start mirabegron (Myrbetriq) 25 mg once daily for weak urinary stream and overactive bladder.   It was a pleasure to see you today!  Preventive Care 11-63 Years Old, Male Preventive care refers to lifestyle choices and visits with your health care provider that can promote health and wellness. This includes: A yearly physical exam. This is also called an annual wellness visit. Regular dental and eye exams. Immunizations. Screening for certain conditions. Healthy lifestyle choices, such as: Eating a healthy diet. Getting regular exercise. Not using drugs or products that contain nicotine and tobacco. Limiting alcohol use. What can I expect for my preventive care visit? Physical exam Your health care provider will check your: Height and weight. These may be used to calculate your BMI (body mass index). BMI is a measurement that tells if you are at a healthy weight. Heart rate and blood pressure. Body temperature. Skin for abnormal spots. Counseling Your health care provider may ask you questions about your: Past medical problems. Family's medical history. Alcohol, tobacco, and drug use. Emotional well-being. Home life and relationship well-being. Sexual activity. Diet, exercise, and sleep habits. Work and work Statistician. Access to firearms. What immunizations do I need?  Vaccines are usually given at various ages, according to a schedule. Your health care provider will recommend vaccines for you based on your age, medicalhistory, and lifestyle or other factors, such as travel or where you work. What tests do I need? Blood tests Lipid and cholesterol levels. These may be checked every 5 years, or more often if you are over 39 years old. Hepatitis C test. Hepatitis B test. Screening Lung cancer screening. You may have this screening every year starting at age 89 if you have a  30-pack-year history of smoking and currently smoke or have quit within the past 15 years. Prostate cancer screening. Recommendations will vary depending on your family history and other risks. Genital exam to check for testicular cancer or hernias. Colorectal cancer screening. All adults should have this screening starting at age 107 and continuing until age 51. Your health care provider may recommend screening at age 23 if you are at increased risk. You will have tests every 1-10 years, depending on your results and the type of screening test. Diabetes screening. This is done by checking your blood sugar (glucose) after you have not eaten for a while (fasting). You may have this done every 1-3 years. STD (sexually transmitted disease) testing, if you are at risk. Follow these instructions at home: Eating and drinking  Eat a diet that includes fresh fruits and vegetables, whole grains, lean protein, and low-fat dairy products. Take vitamin and mineral supplements as recommended by your health care provider. Do not drink alcohol if your health care provider tells you not to drink. If you drink alcohol: Limit how much you have to 0-2 drinks a day. Be aware of how much alcohol is in your drink. In the [place] one drink equals one 12 oz bottle of beer (355 mL), one 5 oz glass of wine (148 mL), or one 1 oz glass of hard liquor (44 mL).  Lifestyle Take daily care of your teeth and gums. Brush your teeth every morning and night with fluoride toothpaste. Floss one time each day. Stay active. Exercise for at least 30 minutes 5 or more days each week. Do not use any products that contain nicotine or tobacco, such as  cigarettes, e-cigarettes, and chewing tobacco. If you need help quitting, ask your health care provider. Do not use drugs. If you are sexually active, practice safe sex. Use a condom or other form of protection to prevent STIs (sexually transmitted infections). If told by your health care  provider, take low-dose aspirin daily starting at age 59. Find healthy ways to cope with stress, such as: Meditation, yoga, or listening to music. Journaling. Talking to a trusted person. Spending time with friends and family. Safety Always wear your seat belt while driving or riding in a vehicle. Do not drive: If you have been drinking alcohol. Do not ride with someone who has been drinking. When you are tired or distracted. While texting. Wear a helmet and other protective equipment during sports activities. If you have firearms in your house, make sure you follow all gun safety procedures. What's next? Go to your health care provider once a year for an annual wellness visit. Ask your health care provider how often you should have your eyes and teeth checked. Stay up to date on all vaccines. This information is not intended to replace advice given to you by your health care provider. Make sure you discuss any questions you have with your healthcare provider. Document Revised: 12/30/2018 Document Reviewed: 03/27/2018 Elsevier Patient Education  2022 [employer].

## 2020-11-08 NOTE — Assessment & Plan Note (Signed)
Stable in the office today, continue olmesartan 20 mg. CMP pending.

## 2020-11-08 NOTE — Assessment & Plan Note (Signed)
Repeat A1C pending.  Continue sitagliptan-metformin XR 50-1000 mg BID, Jardiance 25 mg daily, and Basaglar 40 units daily.  Managed on statin and ARB. Pneumonia vaccine UTD. Eye exam UTD.  Follow up in 6 months.

## 2020-11-08 NOTE — Assessment & Plan Note (Signed)
Appears to be taking levothyroxine correctly, repeat TSH pending. Continue levothyroxine 75 mcg for now.

## 2020-11-08 NOTE — Assessment & Plan Note (Signed)
Doing well on omeprazole 20 mg PRN, takes at bedtime. Continue same.

## 2020-11-09 ENCOUNTER — Telehealth: Payer: Self-pay

## 2020-11-09 DIAGNOSIS — R3915 Urgency of urination: Secondary | ICD-10-CM

## 2020-11-09 DIAGNOSIS — R3912 Poor urinary stream: Secondary | ICD-10-CM

## 2020-11-09 NOTE — Telephone Encounter (Signed)
[facility] KeyRC:[id number] - [place] Case IDYE:[id number] Need help? Call us at [phone] Status Sent to [facility] Myrbetriq '25MG'$  er tablets Form [employer] Electronic [place] Form [id number] NCPDP)

## 2020-11-11 NOTE — Telephone Encounter (Signed)
Mr. Octavia Bruckner called in returning [place] phone call. Related the message and stated that he would like to start the medication.

## 2020-11-11 NOTE — Addendum Note (Signed)
Addended by: Pleas Koch on: 11/11/2020 01:37 PM   Modules accepted: Orders

## 2020-11-11 NOTE — Telephone Encounter (Signed)
Left a detailed Vm (DPR) relaying message from [place] and asking for a call back letting us know if he would like the Rx sent in or not.

## 2020-11-11 NOTE — Telephone Encounter (Signed)
Please call patient and notify him that the medication I prescribed for his urine flow and urgency is not covered by the insurance.  I spoke with a Urology friend of mine who recommended generic Cialis called tadalafil. This is taken everyday and can help with symptoms. She mentions that he can use a Good Rx card and get this filled inexpensively at [facility].  Let me know if he'd like for me to send.

## 2020-11-11 NOTE — Telephone Encounter (Signed)
Does he want for me to send this to [facility] as mentioned or his usual pharmacy ([facility])?

## 2020-11-11 NOTE — Telephone Encounter (Signed)
PA for myrbetriq has been denied. Medication can only be approved when: the member is unable to take the required number of formulary alternatives for the given dx due to an intolerance or contraindication OR the member has tried and failed the required number of formulary alternatives.   Formulary alternatives are: darifenacin ext-rel, oxybutynin ext-rel, solifenacin, tolterodine, tolterodine ext-rel, trospium, trospium ext-rel, Logan Bores, Toviaz. (Requirement: 3 in a class with 3 or more alternatives, 2 in a class with 2 alternatives, or 1 in a class with only 1 alternative.)

## 2020-11-14 ENCOUNTER — Encounter: Payer: Self-pay | Admitting: Primary Care

## 2020-11-15 NOTE — Telephone Encounter (Signed)
Tried calling the patient, but they did not answer LVM for him to call back.

## 2020-11-17 ENCOUNTER — Other Ambulatory Visit: Payer: Self-pay | Admitting: Primary Care

## 2020-11-17 DIAGNOSIS — R3912 Poor urinary stream: Secondary | ICD-10-CM

## 2020-11-17 DIAGNOSIS — R3915 Urgency of urination: Secondary | ICD-10-CM

## 2020-11-17 MED ORDER — TADALAFIL 5 MG PO TABS: 5.0000 mg | ORAL_TABLET | Freq: Every day | ORAL | 0 refills | Status: DC

## 2020-11-17 NOTE — Telephone Encounter (Signed)
Can you submit PA for tadalafil 5 mg?

## 2020-11-17 NOTE — Telephone Encounter (Signed)
Noted, [facility]  

## 2020-11-17 NOTE — Addendum Note (Signed)
Addended by: Pleas Koch on: 11/17/2020 01:37 PM   Modules accepted: Orders

## 2020-11-17 NOTE — Telephone Encounter (Signed)
Mr. Mitchell Donovan returned Mitchell Donovan phone call about which pharmacy to use he stated that he wants [facility].

## 2020-11-22 NOTE — Telephone Encounter (Signed)
Have you seen this ?

## 2020-11-22 NOTE — Telephone Encounter (Signed)
Mitchell Donovan, I sent you the request for PA on his tadalafil (Cialis) at least one week ago. Have you done this?  See my chart message below.

## 2020-11-28 NOTE — Telephone Encounter (Signed)
Pa has been started

## 2020-11-28 NOTE — Telephone Encounter (Signed)
Rual Mirkin Key: [id number] help? Call us at [phone] Status Sent to [facility] Tadalafil '10MG'$  tablets Form [employer] Electronic PA Form [phone] NCPDP)

## 2020-11-29 NOTE — Telephone Encounter (Signed)
Fax received by pharmacy. Medications denied. Form in your box for review.

## 2020-12-03 NOTE — Telephone Encounter (Signed)
Has this been followed up on recently?

## 2020-12-05 NOTE — Telephone Encounter (Signed)
This was denied form was reviewed by Anda Kraft. Per request sending message back to her for instructions.

## 2020-12-07 NOTE — Telephone Encounter (Signed)
Please notify patient that the generic cialis, tadalafil was not covered by his insurance. He can actually get this pretty cheap at [facility] using Good Rx as mentioned previously. Does he want to try? One of my Urology colleagues recommended this.

## 2020-12-07 NOTE — Telephone Encounter (Signed)
Called patient reviewed all information and repeated back to me. Will call if any questions. Patient and I have check [facility] [place] would like script sent there.

## 2020-12-08 MED ORDER — TADALAFIL 5 MG PO TABS: 5.0000 mg | ORAL_TABLET | Freq: Every day | ORAL | 0 refills | Status: DC

## 2020-12-08 NOTE — Telephone Encounter (Signed)
Noted, Rx sent to [facility] pharmacy.

## 2021-01-29 ENCOUNTER — Other Ambulatory Visit: Payer: Self-pay | Admitting: Primary Care

## 2021-01-29 DIAGNOSIS — Z794 Long term (current) use of insulin: Secondary | ICD-10-CM

## 2021-01-29 DIAGNOSIS — E119 Type 2 diabetes mellitus without complications: Secondary | ICD-10-CM

## 2021-02-22 ENCOUNTER — Other Ambulatory Visit: Payer: Self-pay

## 2021-02-22 DIAGNOSIS — E785 Hyperlipidemia, unspecified: Secondary | ICD-10-CM

## 2021-02-22 DIAGNOSIS — R3912 Poor urinary stream: Secondary | ICD-10-CM

## 2021-02-22 DIAGNOSIS — R3915 Urgency of urination: Secondary | ICD-10-CM

## 2021-02-22 MED ORDER — OMEPRAZOLE 20 MG PO CPDR: 20.0000 mg | DELAYED_RELEASE_CAPSULE | Freq: Every day | ORAL | 1 refills | Status: DC

## 2021-02-23 MED ORDER — TADALAFIL 5 MG PO TABS: 5.0000 mg | ORAL_TABLET | Freq: Every day | ORAL | 2 refills | Status: DC

## 2021-02-23 MED ORDER — ATORVASTATIN CALCIUM 20 MG PO TABS: 20.0000 mg | ORAL_TABLET | Freq: Every day | ORAL | 2 refills | Status: DC

## 2021-03-05 ENCOUNTER — Other Ambulatory Visit: Payer: Self-pay | Admitting: Primary Care

## 2021-03-05 DIAGNOSIS — R3915 Urgency of urination: Secondary | ICD-10-CM

## 2021-03-05 DIAGNOSIS — R3912 Poor urinary stream: Secondary | ICD-10-CM

## 2021-03-06 DIAGNOSIS — R3915 Urgency of urination: Secondary | ICD-10-CM

## 2021-03-06 DIAGNOSIS — R3912 Poor urinary stream: Secondary | ICD-10-CM

## 2021-03-07 MED ORDER — TADALAFIL 5 MG PO TABS: 5.0000 mg | ORAL_TABLET | Freq: Every day | ORAL | 2 refills | Status: DC

## 2021-03-17 ENCOUNTER — Telehealth (INDEPENDENT_AMBULATORY_CARE_PROVIDER_SITE_OTHER): Payer: Commercial Managed Care - PPO | Admitting: Primary Care

## 2021-03-17 ENCOUNTER — Other Ambulatory Visit: Payer: Self-pay

## 2021-03-17 ENCOUNTER — Encounter: Payer: Self-pay | Admitting: Primary Care

## 2021-03-17 DIAGNOSIS — J019 Acute sinusitis, unspecified: Secondary | ICD-10-CM

## 2021-03-17 DIAGNOSIS — J329 Chronic sinusitis, unspecified: Secondary | ICD-10-CM | POA: Insufficient documentation

## 2021-03-17 MED ORDER — PREDNISONE 20 MG PO TABS: ORAL_TABLET | ORAL | 0 refills | Status: DC

## 2021-03-17 NOTE — Progress Notes (Signed)
Patient ID: Mitchell Donovan, male    DOB: 11/26/1955, 65 y.o.   MRN: [id number]  Virtual visit completed through [place], a video enabled telemedicine application. Due to national recommendations of social distancing due to COVID-19, a virtual visit is felt to be most appropriate for this patient at this time. Reviewed limitations, risks, security and privacy concerns of performing a virtual visit and the availability of in person appointments. I also reviewed that there may be a patient responsible charge related to this service. The patient agreed to proceed.   Patient location: home Provider location: [place] [facility], office Persons participating in this virtual visit: patient, provider   If any vitals were documented, they were collected by patient at home unless specified below.    Temp 98.9 F (37.2 C) (Temporal)   Wt 180 lb (81.6 kg)   BMI 27.37 kg/m    CC: Sinus Pressure Subjective:   HPI: Mitchell Donovan is a 65 y.o. male with a history of hypertension, GERD, type 2 diabetes, hyperlipidemia presenting on 03/17/2021 for Sinusitis, Nasal Congestion, Generalized Body Aches, and Chills  Symptoms began about 8 days ago with nasal congestion, sinus pressure. Four days ago he developed intermittent chills and body aches which have improved, still intermittent. He was in [place] for Thanksgiving has been outdoors playing golf. His wife has the same symptoms.    He continues to notice nasal congestion when waking up, improved in the morning with Neti Pot rinses and Flonase. Overall feeling better, went golfing three days ago and plans on going today.   He is requesting some help to resolve his symptoms. He tested negative for Covid-19 recently.       Relevant past medical, surgical, family and social history reviewed and updated as indicated. Interim medical history since our last visit reviewed. Allergies and medications reviewed and updated. Outpatient Medications  Prior to Visit  Medication Sig Dispense Refill   aspirin EC 81 MG tablet Take 81 mg by mouth daily. Swallow whole.     atorvastatin (LIPITOR) 20 MG tablet Take 1 tablet (20 mg total) by mouth daily. for cholesterol 90 tablet 2   empagliflozin (JARDIANCE) 25 MG TABS tablet TAKE 1 TABLET DAILY FOR    DIABETES 90 tablet 3   Insulin Glargine (BASAGLAR KWIKPEN) 100 UNIT/ML Inject 40 Units into the skin at bedtime. (Patient taking differently: Inject 40 Units into the skin in the morning.) 45 mL 1   Insulin Pen Needle (BD PEN NEEDLE NANO 2ND GEN) 32G X 4 MM MISC USE DAILY WITH INSULIN 100 each 2   levothyroxine (SYNTHROID) 75 MCG tablet TAKE 1 TABLET EVERY MORNING ON AN EMPTY STOMACH WITH WATER, NO FOOD OR OTHER MEDICATIONS FOR 30 MINUTES 90 tablet 2   olmesartan (BENICAR) 20 MG tablet Take 1 tablet (20 mg total) by mouth daily. for blood pressure 90 tablet 2   omeprazole (PRILOSEC) 20 MG capsule Take 1 capsule (20 mg total) by mouth daily. Please schedule a follow up appointment for further refills. Thank you (Patient taking differently: Take 20 mg by mouth daily as needed.) 30 capsule 1   ONETOUCH ULTRA test strip Use as instructed to test blood sugar up to 4 times daily 300 each 3   SitaGLIPtin-MetFORMIN HCl (JANUMET XR) 50-1000 MG TB24 TAKE 1 TABLET TWICE A DAY  FOR DIABETES 180 tablet 3   tadalafil (CIALIS) 5 MG tablet Take 1 tablet (5 mg total) by mouth daily. For urine flow 90 tablet 2  No facility-administered medications prior to visit.     Per HPI unless specifically indicated in ROS section below Review of Systems Objective:  Temp 98.9 F (37.2 C) (Temporal)   Wt 180 lb (81.6 kg)   BMI 27.37 kg/m   Wt Readings from Last 3 Encounters:  03/17/21 180 lb (81.6 kg)  11/08/20 179 lb (81.2 kg)  08/31/20 183 lb (83 kg)       Physical exam: General: Alert and oriented x 3, no distress, does not appear sickly  Pulmonary: Speaks in complete sentences without increased work of breathing,  no cough during visit.  Psychiatric: Normal mood, thought content, and behavior.     Results for orders placed or performed in visit on 11/14/20  HM DIABETES EYE EXAM  Result Value Ref Range   HM Diabetic Eye Exam No Retinopathy No Retinopathy   Assessment & Plan:   Problem List Items Addressed This Visit       Respiratory   Sinusitis    Suspect viral etiology, especially given HPI and presentation today.  Since he's feeling better overall, will continue to treat conservatively.   Rx for prednisone burst, 40 mg daily x 5 days sent to pharmacy.   He will update if no improvement.       Relevant Medications   predniSONE (DELTASONE) 20 MG tablet     Meds ordered this encounter  Medications   predniSONE (DELTASONE) 20 MG tablet    Sig: Take 2 tablets by mouth once daily for 5 days.    Dispense:  10 tablet    Refill:  0    Order Specific Question:   Supervising Provider    Answer:   BEDSOLE, AMY E [[id number]]   No orders of the defined types were placed in this encounter.   I discussed the assessment and treatment plan with the patient. The patient was provided an opportunity to ask questions and all were answered. The patient agreed with the plan and demonstrated an understanding of the instructions. The patient was advised to call back or seek an in-person evaluation if the symptoms worsen or if the condition fails to improve as anticipated.  Follow up plan:  Start prednisone 20 mg tablets. Take 2 tablets by mouth once daily for 5 days.  Please update me if symptoms haven't resolved within a few days.  It was a pleasure to see you today!   Pleas Koch, NP

## 2021-03-17 NOTE — Patient Instructions (Signed)
Start prednisone 20 mg tablets. Take 2 tablets by mouth once daily for 5 days.  Please update me if symptoms haven't resolved within a few days.  It was a pleasure to see you today!

## 2021-03-17 NOTE — Assessment & Plan Note (Signed)
Suspect viral etiology, especially given HPI and presentation today.  Since he's feeling better overall, will continue to treat conservatively.   Rx for prednisone burst, 40 mg daily x 5 days sent to pharmacy.   He will update if no improvement.

## 2021-04-18 ENCOUNTER — Telehealth: Payer: Self-pay | Admitting: Primary Care

## 2021-04-18 DIAGNOSIS — E119 Type 2 diabetes mellitus without complications: Secondary | ICD-10-CM

## 2021-04-18 DIAGNOSIS — Z794 Long term (current) use of insulin: Secondary | ICD-10-CM

## 2021-04-18 NOTE — Telephone Encounter (Signed)
Patient due for diabetes follow up now. Please send letter.

## 2021-04-26 NOTE — Telephone Encounter (Signed)
Left message to return call to our office.  

## 2021-04-27 NOTE — Telephone Encounter (Signed)
Mr. Mitchell Donovan called in returning [name] phone call back. Related the message and got him scheduled for 2/14 @2 :20

## 2021-05-30 ENCOUNTER — Other Ambulatory Visit: Payer: Self-pay

## 2021-05-30 ENCOUNTER — Encounter: Payer: Self-pay | Admitting: Primary Care

## 2021-05-30 ENCOUNTER — Ambulatory Visit: Payer: Commercial Managed Care - PPO | Admitting: Primary Care

## 2021-05-30 VITALS — BP 132/64 | HR 82 | Temp 98.6°F | Ht 68.0 in | Wt 185.0 lb

## 2021-05-30 DIAGNOSIS — E1165 Type 2 diabetes mellitus with hyperglycemia: Secondary | ICD-10-CM

## 2021-05-30 DIAGNOSIS — E119 Type 2 diabetes mellitus without complications: Secondary | ICD-10-CM

## 2021-05-30 DIAGNOSIS — R3912 Poor urinary stream: Secondary | ICD-10-CM

## 2021-05-30 DIAGNOSIS — Z794 Long term (current) use of insulin: Secondary | ICD-10-CM

## 2021-05-30 LAB — POCT GLYCOSYLATED HEMOGLOBIN (HGB A1C): Hemoglobin A1C: 7.3 % — AB (ref 4.0–5.6)

## 2021-05-30 MED ORDER — FINASTERIDE 5 MG PO TABS: 5.0000 mg | ORAL_TABLET | Freq: Every day | ORAL | 1 refills | Status: DC

## 2021-05-30 NOTE — Assessment & Plan Note (Addendum)
Improved but not at goal.  Continue Cialis 5 mg daily. Add finasteride 5 mg daily.  Discussed that it may take several months to notice improvement.   PSA reviewed from July 2022 in [place].

## 2021-05-30 NOTE — Patient Instructions (Signed)
Start finasteride 5 mg once daily for weak urinary stream. Continue Cialis 5 mg daily.  Keep up the great work with your activity level and diet!  It was a pleasure to see you today!

## 2021-05-30 NOTE — Progress Notes (Signed)
Subjective:    Patient ID: Mitchell Donovan, male    DOB: 30-Mar-1956, 66 y.o.   MRN: [id number]  HPI  Mitchell Donovan is a very pleasant 66 y.o. male with a history of hypertension, type 2 diabetes, hyperlipidemia, hypothyroidism who presents today for follow up of diabetes and weak urinary stream.   1) Type 2 Diabetes:  Current medications include: Jardiance 25 mg daily, Basaglar 40 units daily, sitagliptin-metformin 50-1000 mg BID.   He is checking his blood glucose 1 times daily and is getting readings of 80's-150's. Average around 120's.   Last A1C: 8.0 in July 2022, 7.3 today Last Eye Exam: UTD Last Foot Exam: Due Pneumonia Vaccination: 2020 Urine Microalbumin: None. Managed on ARB Statin: atorvastatin   Dietary changes since last visit: Increased protein, green vegetables. He has limited sweets.    Exercise: Golfing twice weekly, rides holes. Strength training.   2) Weak Urinary Stream: Chronic. Currently managed on Cialis 5 mg daily which was initiated in last year. Initially he noticed significant improvement in his urinary stream, but the effects have reduced. He's about 50% better than he was initially.  He's failed finasteride alone, he's also failed Myrbetriq.   He is interested in additional improvement.   BP Readings from Last 3 Encounters:  05/30/21 132/64  11/08/20 130/80  08/31/20 120/81       Review of Systems  Eyes:  Negative for visual disturbance.  Respiratory:  Negative for shortness of breath.   Cardiovascular:  Negative for chest pain.  Genitourinary:  Positive for difficulty urinating. Negative for hematuria.  Neurological:  Positive for numbness.       To plantar feet, mostly at night.         Past Medical History:  Diagnosis Date   Essential hypertension    on meds   GERD (gastroesophageal reflux disease)    with certain foods/OTC PRN meds   Hyperlipidemia    on meds   Hypothyroidism    on meds   Migraines    Overactive  bladder    Skin cancer    Type 2 diabetes mellitus ([place])    on meds    Social History   Socioeconomic History   Marital status: Married    Spouse name: Not on file   Number of children: 1   Years of education: Not on file   Highest education level: Not on file  Occupational History   Occupation: Chief Financial Officer  Tobacco Use   Smoking status: Former    Types: Cigarettes, Cigars   Smokeless tobacco: Never   Tobacco comments:    in college, still occasionaly smoke cigars  Vaping Use   Vaping Use: Never used  Substance and Sexual Activity   Alcohol use: Yes    Alcohol/week: 12.0 standard drinks    Types: 12 Standard drinks or equivalent per week   Drug use: Never   Sexual activity: Not on file  Other Topics Concern   Not on file  Social History Narrative   Married.   1 child.   Works as an Chief Financial Officer for [employer].   Enjoys golfing, fishing, traveling.    Social Determinants of Health   Financial Resource Strain: Not on file  Food Insecurity: Not on file  Transportation Needs: Not on file  Physical Activity: Not on file  Stress: Not on file  Social Connections: Not on file  Intimate Partner Violence: Not on file    Past Surgical History:  Procedure Laterality Date   COLONOSCOPY  2011   in [place]     in office   KNEE SURGERY Left 1988   LASIK  2008   MOHS SURGERY  2006   Nose   WISDOM TOOTH EXTRACTION      Family History  Problem Relation Age of Onset   Heart disease Mother    COPD Mother    Diabetes Mother    Heart disease Father    Hyperlipidemia Father    Diabetes Sister    Stomach cancer Neg Hx    Rectal cancer Neg Hx    Esophageal cancer Neg Hx    Colon cancer Neg Hx    Colon polyps Neg Hx     Allergies  Allergen Reactions   Sporanos [Itraconazole] Swelling    Current Outpatient Medications on File Prior to Visit  Medication Sig Dispense Refill   aspirin EC 81 MG tablet Take 81 mg by mouth daily. Swallow whole.      atorvastatin (LIPITOR) 20 MG tablet Take 1 tablet (20 mg total) by mouth daily. for cholesterol 90 tablet 2   empagliflozin (JARDIANCE) 25 MG TABS tablet TAKE 1 TABLET DAILY FOR    DIABETES 90 tablet 3   Insulin Glargine (BASAGLAR KWIKPEN) 100 UNIT/ML Inject 40 Units into the skin at bedtime. Office visit required for further refills. 45 mL 0   Insulin Pen Needle (BD PEN NEEDLE NANO 2ND GEN) 32G X 4 MM MISC USE DAILY WITH INSULIN 100 each 2   levothyroxine (SYNTHROID) 75 MCG tablet TAKE 1 TABLET EVERY MORNING ON AN EMPTY STOMACH WITH WATER, NO FOOD OR OTHER MEDICATIONS FOR 30 MINUTES 90 tablet 2   olmesartan (BENICAR) 20 MG tablet Take 1 tablet (20 mg total) by mouth daily. for blood pressure 90 tablet 2   omeprazole (PRILOSEC) 20 MG capsule Take 1 capsule (20 mg total) by mouth daily. Please schedule a follow up appointment for further refills. Thank you (Patient taking differently: Take 20 mg by mouth daily as needed.) 30 capsule 1   ONETOUCH ULTRA test strip Use as instructed to test blood sugar up to 4 times daily 300 each 3   predniSONE (DELTASONE) 20 MG tablet Take 2 tablets by mouth once daily for 5 days. 10 tablet 0   SitaGLIPtin-MetFORMIN HCl (JANUMET XR) 50-1000 MG TB24 TAKE 1 TABLET TWICE A DAY  FOR DIABETES 180 tablet 3   tadalafil (CIALIS) 5 MG tablet Take 1 tablet (5 mg total) by mouth daily. For urine flow 90 tablet 2   No current facility-administered medications on file prior to visit.    BP 132/64    Pulse 82    Temp 98.6 F (37 C) (Oral)    Ht 5\' 8"  (1.727 m)    Wt 185 lb (83.9 kg)    SpO2 96%    BMI 28.13 kg/m  Objective:   Physical Exam Cardiovascular:     Rate and Rhythm: Normal rate and regular rhythm.  Pulmonary:     Effort: Pulmonary effort is normal.     Breath sounds: Normal breath sounds. No wheezing or rales.  Musculoskeletal:     Cervical back: Neck supple.  Skin:    General: Skin is warm and dry.  Neurological:     Mental Status: He is alert and oriented  to person, place, and time.          Assessment & Plan:      This visit occurred during the SARS-CoV-2 public health emergency.  Safety protocols  were in place, including screening questions prior to the visit, additional usage of staff PPE, and extensive cleaning of exam room while observing appropriate contact time as indicated for disinfecting solutions.

## 2021-05-30 NOTE — Assessment & Plan Note (Signed)
Improved with A1C today of 7.3!  Commended him on dietary changes and activity. Encouraged to continue.  Continue Basaglar 40 units daily, sitagliptin-metformin 50-1000 mg BID, Jardiance 25 mg daily.  Foot exam today. Follow up in 6 months

## 2021-06-06 ENCOUNTER — Other Ambulatory Visit: Payer: Self-pay

## 2021-06-06 ENCOUNTER — Telehealth (INDEPENDENT_AMBULATORY_CARE_PROVIDER_SITE_OTHER): Payer: Commercial Managed Care - PPO | Admitting: Family

## 2021-06-06 ENCOUNTER — Encounter: Payer: Self-pay | Admitting: Family

## 2021-06-06 DIAGNOSIS — K529 Noninfective gastroenteritis and colitis, unspecified: Secondary | ICD-10-CM

## 2021-06-06 DIAGNOSIS — R509 Fever, unspecified: Secondary | ICD-10-CM

## 2021-06-06 LAB — POC COVID19 BINAXNOW: SARS Coronavirus 2 Ag: NEGATIVE

## 2021-06-06 LAB — POC INFLUENZA A&B (BINAX/QUICKVUE)
Influenza A, POC: NEGATIVE
Influenza B, POC: NEGATIVE

## 2021-06-06 NOTE — Progress Notes (Signed)
MyChart Video Visit    Virtual Visit via Video Note   This visit type was conducted due to national recommendations for restrictions regarding the COVID-19 Pandemic (e.g. social distancing) in an effort to limit this patient's exposure and mitigate transmission in our community. This patient is at least at moderate risk for complications without adequate follow up. This format is felt to be most appropriate for this patient at this time. Physical exam was limited by quality of the video and audio technology used for the visit. CMA was able to get the patient set up on a video visit.  Patient location: Home. Patient and provider in visit Provider location: Office  I discussed the limitations of evaluation and management by telemedicine and the availability of in person appointments. The patient expressed understanding and agreed to proceed.  Visit Date: 06/06/2021  Today's healthcare provider: Eugenia Pancoast, FNP     Subjective:    Patient ID: Mitchell Donovan, male    DOB: 10-19-1955, 66 y.o.   MRN: [id number]  Chief Complaint  Patient presents with   Emesis    Pt stated--ate fruit cocktail last night--around 9:00 pm start vomiting, chills, body ache, fatigue. Denied fever.    Emesis  Associated symptoms include chills. Pertinent negatives include no abdominal pain, coughing, diarrhea or fever.   Last night started with chills, body aches, and decreased appetite.  Had some tea and toast and a little cup of fruit cocktail and went to bed, around 3 am woke up and threw up. This am upon wakening threw up again two times. Still with decreased appetite, had a bowl of cereal prior to throwing up. Seems to help when he lies down.   Denies abdominal pain.  Denies diarrhea. Last bowel movement was yesterday afternoon, was normal bowel for him well formed.  Not nauseous only prior to throwing up.  Denies fever, but thinks may have last night when he had chills.  Low back pain/body  ache not severe.  No urinary frequency, urgency and or dysuria.  Guy at work did have stomach bug about one week ago.   Past Medical History:  Diagnosis Date   Essential hypertension    on meds   GERD (gastroesophageal reflux disease)    with certain foods/OTC PRN meds   Hyperlipidemia    on meds   Hypothyroidism    on meds   Migraines    Overactive bladder    Skin cancer    Type 2 diabetes mellitus ([place])    on meds    Past Surgical History:  Procedure Laterality Date   COLONOSCOPY  2011   in [place]     in office   KNEE SURGERY Left 1988   LASIK  2008   MOHS SURGERY  2006   Nose   WISDOM TOOTH EXTRACTION      Family History  Problem Relation Age of Onset   Heart disease Mother    COPD Mother    Diabetes Mother    Heart disease Father    Hyperlipidemia Father    Diabetes Sister    Stomach cancer Neg Hx    Rectal cancer Neg Hx    Esophageal cancer Neg Hx    Colon cancer Neg Hx    Colon polyps Neg Hx     Social History   Socioeconomic History   Marital status: Married    Spouse name: Not on file   Number of children: 1   Years of  education: Not on file   Highest education level: Not on file  Occupational History   Occupation: Chief Financial Officer  Tobacco Use   Smoking status: Former    Types: Cigarettes, Cigars   Smokeless tobacco: Never   Tobacco comments:    in college, still occasionaly smoke cigars  Vaping Use   Vaping Use: Never used  Substance and Sexual Activity   Alcohol use: Yes    Alcohol/week: 12.0 standard drinks    Types: 12 Standard drinks or equivalent per week   Drug use: Never   Sexual activity: Not on file  Other Topics Concern   Not on file  Social History Narrative   Married.   1 child.   Works as an Chief Financial Officer for [employer].   Enjoys golfing, fishing, traveling.    Social Determinants of Health   Financial Resource Strain: Not on file  Food Insecurity: Not on file  Transportation Needs: Not on file   Physical Activity: Not on file  Stress: Not on file  Social Connections: Not on file  Intimate Partner Violence: Not on file    Outpatient Medications Prior to Visit  Medication Sig Dispense Refill   aspirin EC 81 MG tablet Take 81 mg by mouth daily. Swallow whole.     atorvastatin (LIPITOR) 20 MG tablet Take 1 tablet (20 mg total) by mouth daily. for cholesterol 90 tablet 2   empagliflozin (JARDIANCE) 25 MG TABS tablet TAKE 1 TABLET DAILY FOR    DIABETES 90 tablet 3   finasteride (PROSCAR) 5 MG tablet Take 1 tablet (5 mg total) by mouth daily. For weak urinary stream 90 tablet 1   Insulin Glargine (BASAGLAR KWIKPEN) 100 UNIT/ML Inject 40 Units into the skin at bedtime. Office visit required for further refills. 45 mL 0   Insulin Pen Needle (BD PEN NEEDLE NANO 2ND GEN) 32G X 4 MM MISC USE DAILY WITH INSULIN 100 each 2   levothyroxine (SYNTHROID) 75 MCG tablet TAKE 1 TABLET EVERY MORNING ON AN EMPTY STOMACH WITH WATER, NO FOOD OR OTHER MEDICATIONS FOR 30 MINUTES 90 tablet 2   olmesartan (BENICAR) 20 MG tablet Take 1 tablet (20 mg total) by mouth daily. for blood pressure 90 tablet 2   ONETOUCH ULTRA test strip Use as instructed to test blood sugar up to 4 times daily 300 each 3   SitaGLIPtin-MetFORMIN HCl (JANUMET XR) 50-1000 MG TB24 TAKE 1 TABLET TWICE A DAY  FOR DIABETES 180 tablet 3   tadalafil (CIALIS) 5 MG tablet Take 1 tablet (5 mg total) by mouth daily. For urine flow 90 tablet 2   omeprazole (PRILOSEC) 20 MG capsule Take 1 capsule (20 mg total) by mouth daily. Please schedule a follow up appointment for further refills. Thank you (Patient not taking: Reported on 06/06/2021) 30 capsule 1   predniSONE (DELTASONE) 20 MG tablet Take 2 tablets by mouth once daily for 5 days. (Patient not taking: Reported on 06/06/2021) 10 tablet 0   No facility-administered medications prior to visit.    Allergies  Allergen Reactions   Sporanos [Itraconazole] Swelling    Review of Systems   Constitutional:  Positive for chills. Negative for fever.  HENT:  Negative for congestion, ear pain, sinus pain and sore throat.   Respiratory:  Negative for cough, shortness of breath and wheezing.   Gastrointestinal:  Positive for nausea and vomiting. Negative for abdominal pain, blood in stool, constipation, diarrhea and heartburn.      Objective:    Physical Exam Constitutional:  Appearance: Normal appearance. He is obese.  Pulmonary:     Effort: Pulmonary effort is normal.  Neurological:     General: No focal deficit present.     Mental Status: He is alert and oriented to person, place, and time.  Psychiatric:        Mood and Affect: Mood normal.        Behavior: Behavior normal.        Thought Content: Thought content normal.    There were no vitals taken for this visit. Wt Readings from Last 3 Encounters:  05/30/21 185 lb (83.9 kg)  03/17/21 180 lb (81.6 kg)  11/08/20 179 lb (81.2 kg)       Assessment & Plan:   Problem List Items Addressed This Visit       Digestive   Gastroenteritis - Primary    Suspected GI virus.  Recommended Zofran however patient declines.  Patient to increase oral fluid intake as tolerated discussed brat diet with patient as well.  Warm salty p.o.  Patient to follow-up if no improvement in the next few days.        Other   Fever    We will test for both flu and COVID patient to come by his nurse visit only.  Pending results      Relevant Orders   POC COVID-19 BinaxNow (Completed)   POC Influenza A&B(BINAX/QUICKVUE) (Completed)    I have discontinued Mitchell Donovan "Tim"'s omeprazole and predniSONE. I am also having him maintain his aspirin EC, Janumet XR, empagliflozin, levothyroxine, olmesartan, OneTouch Ultra, BD Pen Needle Nano 2nd Gen, atorvastatin, tadalafil, Basaglar KwikPen, and finasteride.  No orders of the defined types were placed in this encounter.   I discussed the assessment and treatment plan with the  patient. The patient was provided an opportunity to ask questions and all were answered. The patient agreed with the plan and demonstrated an understanding of the instructions.   The patient was advised to call back or seek an in-person evaluation if the symptoms worsen or if the condition fails to improve as anticipated.  I provided 17 minutes of face-to-face time during this encounter.   Eugenia Pancoast, [facility] [phone] (phone) [phone] (fax)  [place]

## 2021-06-08 DIAGNOSIS — K529 Noninfective gastroenteritis and colitis, unspecified: Secondary | ICD-10-CM

## 2021-06-08 DIAGNOSIS — R509 Fever, unspecified: Secondary | ICD-10-CM | POA: Insufficient documentation

## 2021-06-08 HISTORY — DX: Noninfective gastroenteritis and colitis, unspecified: K52.9

## 2021-06-08 NOTE — Assessment & Plan Note (Signed)
Suspected GI virus.  Recommended Zofran however patient declines.  Patient to increase oral fluid intake as tolerated discussed brat diet with patient as well.  Warm salty p.o.  Patient to follow-up if no improvement in the next few days.

## 2021-06-08 NOTE — Assessment & Plan Note (Signed)
We will test for both flu and COVID patient to come by his nurse visit only.  Pending results

## 2021-07-25 LAB — HM DIABETES EYE EXAM

## 2021-07-27 ENCOUNTER — Other Ambulatory Visit: Payer: Self-pay | Admitting: Primary Care

## 2021-07-27 DIAGNOSIS — E119 Type 2 diabetes mellitus without complications: Secondary | ICD-10-CM

## 2021-08-11 ENCOUNTER — Ambulatory Visit (INDEPENDENT_AMBULATORY_CARE_PROVIDER_SITE_OTHER): Payer: Commercial Managed Care - PPO | Admitting: Podiatry

## 2021-08-11 ENCOUNTER — Ambulatory Visit (INDEPENDENT_AMBULATORY_CARE_PROVIDER_SITE_OTHER): Payer: Commercial Managed Care - PPO

## 2021-08-11 DIAGNOSIS — M7662 Achilles tendinitis, left leg: Secondary | ICD-10-CM

## 2021-08-11 NOTE — Progress Notes (Signed)
? ?  HPI: 66 y.o. male presenting today for new complaint of pain regarding the posterior aspect of the left back of the heel.  Patient states that he has no pain throughout the day however when he does yoga and a downward dog position he notices tight pulling sensation to the Achilles tendon and posterior aspect of the heel.  He denies a history of injury.  Again, he has no symptoms throughout the day.  He says if he did not do the downward dog he would not even know he had an issue.  He has not done anything for treatment ? ?Past Medical History:  ?Diagnosis Date  ? Essential hypertension   ? on meds  ? GERD (gastroesophageal reflux disease)   ? with certain foods/OTC PRN meds  ? Hyperlipidemia   ? on meds  ? Hypothyroidism   ? on meds  ? Migraines   ? Overactive bladder   ? Skin cancer   ? Type 2 diabetes mellitus ([place])   ? on meds  ? ? ?Past Surgical History:  ?Procedure Laterality Date  ? COLONOSCOPY  2011  ? in [place]  ? HEMORRHOID BANDING    ? in office  ? KNEE SURGERY Left 1988  ? LASIK  2008  ? MOHS SURGERY  2006  ? Nose  ? WISDOM TOOTH EXTRACTION    ? ? ?Allergies  ?Allergen Reactions  ? Sporanos [Itraconazole] Swelling  ? ?  ?Physical Exam: ?General: The patient is alert and oriented x3 in no acute distress. ? ?Dermatology: Skin is warm, dry and supple bilateral lower extremities. Negative for open lesions or macerations. ? ?Vascular: Palpable pedal pulses bilaterally. Capillary refill within normal limits.  Negative for any significant edema or erythema ? ?Neurological: Light touch and protective threshold grossly intact ? ?Musculoskeletal Exam: No pedal deformities noted.  There is no pain on palpation throughout the Achilles tendon or posterior heel ? ?Radiographic Exam:  ?Normal osseous mineralization. Joint spaces preserved. No fracture/dislocation/boney destruction.  Negative for posterior heel spur.  There is a plantar heel spur which clinically is asymptomatic ? ?Assessment: ?1.  Insertional  Achilles tendinitis/possible tendinosis only when the patient is in a yoga downward dog position ? ? ?Plan of Care:  ?1. Patient evaluated. X-Rays reviewed.  ?2.  Recommend light stretching exercises daily ?3.  Recommend OTC arch supports as needed ?4.  Advised against the yoga downward dog position if this elicits pain. ?5.  Return to clinic as needed ? ?*Works for [employer].  Hoping to retire in 2 years.  Avid Counselling psychologist ? ?  ?  ?Edrick Kins, DPM ?[place] ? ?Dr. Edrick Kins, DPM  ?  ?[address]                                        ?[place], [place] [address]                ?Office [phone]  ?Fax [phone] ? ? ? ? ?

## 2021-09-28 ENCOUNTER — Other Ambulatory Visit: Payer: Self-pay | Admitting: Primary Care

## 2021-09-28 DIAGNOSIS — I10 Essential (primary) hypertension: Secondary | ICD-10-CM

## 2021-10-23 ENCOUNTER — Other Ambulatory Visit: Payer: Self-pay | Admitting: Primary Care

## 2021-10-23 DIAGNOSIS — R3912 Poor urinary stream: Secondary | ICD-10-CM

## 2021-11-07 ENCOUNTER — Other Ambulatory Visit: Payer: Self-pay | Admitting: Primary Care

## 2021-11-07 DIAGNOSIS — I10 Essential (primary) hypertension: Secondary | ICD-10-CM

## 2021-11-07 DIAGNOSIS — R3912 Poor urinary stream: Secondary | ICD-10-CM

## 2021-11-07 DIAGNOSIS — Z794 Long term (current) use of insulin: Secondary | ICD-10-CM

## 2021-11-07 MED ORDER — BASAGLAR KWIKPEN 100 UNIT/ML ~~LOC~~ SOPN: 40.0000 [IU] | PEN_INJECTOR | Freq: Every day | SUBCUTANEOUS | 0 refills | Status: DC

## 2021-11-11 ENCOUNTER — Other Ambulatory Visit: Payer: Self-pay | Admitting: Primary Care

## 2021-11-11 DIAGNOSIS — E119 Type 2 diabetes mellitus without complications: Secondary | ICD-10-CM

## 2021-11-21 ENCOUNTER — Other Ambulatory Visit: Payer: Self-pay | Admitting: Primary Care

## 2021-11-21 ENCOUNTER — Encounter: Payer: Self-pay | Admitting: Primary Care

## 2021-11-21 ENCOUNTER — Ambulatory Visit (INDEPENDENT_AMBULATORY_CARE_PROVIDER_SITE_OTHER): Payer: Commercial Managed Care - PPO | Admitting: Primary Care

## 2021-11-21 VITALS — BP 132/64 | HR 68 | Temp 98.6°F | Ht 68.0 in | Wt 183.0 lb

## 2021-11-21 DIAGNOSIS — E1165 Type 2 diabetes mellitus with hyperglycemia: Secondary | ICD-10-CM

## 2021-11-21 DIAGNOSIS — M7710 Lateral epicondylitis, unspecified elbow: Secondary | ICD-10-CM | POA: Insufficient documentation

## 2021-11-21 DIAGNOSIS — E785 Hyperlipidemia, unspecified: Secondary | ICD-10-CM | POA: Diagnosis not present

## 2021-11-21 DIAGNOSIS — Z794 Long term (current) use of insulin: Secondary | ICD-10-CM | POA: Diagnosis not present

## 2021-11-21 DIAGNOSIS — Z0000 Encounter for general adult medical examination without abnormal findings: Secondary | ICD-10-CM | POA: Diagnosis not present

## 2021-11-21 DIAGNOSIS — Z125 Encounter for screening for malignant neoplasm of prostate: Secondary | ICD-10-CM

## 2021-11-21 DIAGNOSIS — E039 Hypothyroidism, unspecified: Secondary | ICD-10-CM

## 2021-11-21 DIAGNOSIS — E119 Type 2 diabetes mellitus without complications: Secondary | ICD-10-CM

## 2021-11-21 DIAGNOSIS — I10 Essential (primary) hypertension: Secondary | ICD-10-CM | POA: Diagnosis not present

## 2021-11-21 DIAGNOSIS — R3912 Poor urinary stream: Secondary | ICD-10-CM

## 2021-11-21 DIAGNOSIS — M7711 Lateral epicondylitis, right elbow: Secondary | ICD-10-CM

## 2021-11-21 MED ORDER — FINASTERIDE 5 MG PO TABS: 5.0000 mg | ORAL_TABLET | Freq: Every day | ORAL | 3 refills | Status: DC

## 2021-11-21 NOTE — Assessment & Plan Note (Signed)
Repeat A1C pending today.  Continue Basaglar 40 units daily. Continue sitaglipitin-metformin 50-1000 mg BID. Continue Jardiance 25 mg daily.  Foot and eye exams UTD. Pneumonia vaccine UTD.  Follow up in 3-6 months based on A1C results

## 2021-11-21 NOTE — Assessment & Plan Note (Signed)
Immunizations UTD. PSA due and pending. Colonoscopy UTD, due 2029.  Discussed the importance of a healthy diet and regular exercise in order for weight loss, and to reduce the risk of further co-morbidity.  Exam stable. Labs pending.  Follow up in 1 year for repeat physical.

## 2021-11-21 NOTE — Assessment & Plan Note (Signed)
He is taking levothyroxine correctly.  Continue levothyroxine 75 mcg daily. Repeat TSH pending.

## 2021-11-21 NOTE — Assessment & Plan Note (Signed)
Controlled.  Continue olmesartan 20 mg daily daily. CMP pending.

## 2021-11-21 NOTE — Assessment & Plan Note (Signed)
Following with orthopedics. Office notes reviewed from August 2023.  Continue with rest, PRN NSAID's, elbow band.

## 2021-11-21 NOTE — Patient Instructions (Signed)
Stop by the lab prior to leaving today. I will notify you of your results once received.   Please schedule a follow up visit for 6 months for a diabetes check.  It was a pleasure to see you today!  Preventive Care 64-64 Years Old, Male Preventive care refers to lifestyle choices and visits with your health care provider that can promote health and wellness. Preventive care visits are also called wellness exams. What can I expect for my preventive care visit? Counseling During your preventive care visit, your health care provider may ask about your: Medical history, including: Past medical problems. Family medical history. Current health, including: Emotional well-being. Home life and relationship well-being. Sexual activity. Lifestyle, including: Alcohol, nicotine or tobacco, and drug use. Access to firearms. Diet, exercise, and sleep habits. Safety issues such as seatbelt and bike helmet use. Sunscreen use. Work and work Statistician. Physical exam Your health care provider will check your: Height and weight. These may be used to calculate your BMI (body mass index). BMI is a measurement that tells if you are at a healthy weight. Waist circumference. This measures the distance around your waistline. This measurement also tells if you are at a healthy weight and may help predict your risk of certain diseases, such as type 2 diabetes and high blood pressure. Heart rate and blood pressure. Body temperature. Skin for abnormal spots. What immunizations do I need?  Vaccines are usually given at various ages, according to a schedule. Your health care provider will recommend vaccines for you based on your age, medical history, and lifestyle or other factors, such as travel or where you work. What tests do I need? Screening Your health care provider may recommend screening tests for certain conditions. This may include: Lipid and cholesterol levels. Diabetes screening. This is done by  checking your blood sugar (glucose) after you have not eaten for a while (fasting). Hepatitis B test. Hepatitis C test. HIV (human immunodeficiency virus) test. STI (sexually transmitted infection) testing, if you are at risk. Lung cancer screening. Prostate cancer screening. Colorectal cancer screening. Talk with your health care provider about your test results, treatment options, and if necessary, the need for more tests. Follow these instructions at home: Eating and drinking  Eat a diet that includes fresh fruits and vegetables, whole grains, lean protein, and low-fat dairy products. Take vitamin and mineral supplements as recommended by your health care provider. Do not drink alcohol if your health care provider tells you not to drink. If you drink alcohol: Limit how much you have to 0-2 drinks a day. Know how much alcohol is in your drink. In the [place] one drink equals one 12 oz bottle of beer (355 mL), one 5 oz glass of wine (148 mL), or one 1 oz glass of hard liquor (44 mL). Lifestyle Brush your teeth every morning and night with fluoride toothpaste. Floss one time each day. Exercise for at least 30 minutes 5 or more days each week. Do not use any products that contain nicotine or tobacco. These products include cigarettes, chewing tobacco, and vaping devices, such as e-cigarettes. If you need help quitting, ask your health care provider. Do not use drugs. If you are sexually active, practice safe sex. Use a condom or other form of protection to prevent STIs. Take aspirin only as told by your health care provider. Make sure that you understand how much to take and what form to take. Work with your health care provider to find out whether it is  safe and beneficial for you to take aspirin daily. Find healthy ways to manage stress, such as: Meditation, yoga, or listening to music. Journaling. Talking to a trusted person. Spending time with friends and family. Minimize exposure to  UV radiation to reduce your risk of skin cancer. Safety Always wear your seat belt while driving or riding in a vehicle. Do not drive: If you have been drinking alcohol. Do not ride with someone who has been drinking. When you are tired or distracted. While texting. If you have been using any mind-altering substances or drugs. Wear a helmet and other protective equipment during sports activities. If you have firearms in your house, make sure you follow all gun safety procedures. What's next? Go to your health care provider once a year for an annual wellness visit. Ask your health care provider how often you should have your eyes and teeth checked. Stay up to date on all vaccines. This information is not intended to replace advice given to you by your health care provider. Make sure you discuss any questions you have with your health care provider. Document Revised: 09/28/2020 Document Reviewed: 09/28/2020 Elsevier Patient Education  [place].

## 2021-11-21 NOTE — Assessment & Plan Note (Signed)
Improved!  Continue tadalafil 5 mg daily and finasteride 5 mg daily.

## 2021-11-21 NOTE — Assessment & Plan Note (Signed)
Continue atorvastatin 20 mg daily. Repeat lipid panel pending. 

## 2021-11-21 NOTE — Progress Notes (Signed)
Subjective:    Patient ID: Mitchell Donovan, male    DOB: April 08, 1956, 66 y.o.   MRN: [id number]  HPI  Mitchell Donovan is a very pleasant 66 y.o. male who presents today for complete physical and follow up of chronic conditions.  Immunizations: -Tetanus: 2015 -Influenza: Due this season  -Covid-19: 3 vaccines -Shingles: Completed Shingrix series -Pneumonia: 2020  Diet: Fair diet.  Exercise: No regular exercise.  Eye exam: Completes annually  Dental exam: Completes semi-annually   Colonoscopy: Completed in 2022, due 2029  PSA: Due  BP Readings from Last 3 Encounters:  11/21/21 132/64  05/30/21 132/64  11/08/20 130/80         Review of Systems  Constitutional:  Negative for unexpected weight change.  HENT:  Negative for rhinorrhea.   Eyes:  Negative for visual disturbance.  Respiratory:  Negative for cough and shortness of breath.   Cardiovascular:  Negative for chest pain.  Gastrointestinal:  Negative for constipation and diarrhea.  Genitourinary:  Negative for difficulty urinating.  Musculoskeletal:  Positive for arthralgias.  Skin:  Negative for rash.  Allergic/Immunologic: Negative for environmental allergies.  Neurological:  Negative for dizziness and headaches.  Psychiatric/Behavioral:  The patient is not nervous/anxious.          Past Medical History:  Diagnosis Date   Essential hypertension    on meds   GERD (gastroesophageal reflux disease)    with certain foods/OTC PRN meds   Hyperlipidemia    on meds   Hypothyroidism    on meds   Migraines    Overactive bladder    Skin cancer    Type 2 diabetes mellitus ([place])    on meds    Social History   Socioeconomic History   Marital status: Married    Spouse name: Not on file   Number of children: 1   Years of education: Not on file   Highest education level: Not on file  Occupational History   Occupation: Chief Financial Officer  Tobacco Use   Smoking status: Former    Types: Cigarettes, Cigars    Smokeless tobacco: Never   Tobacco comments:    in college, still occasionaly smoke cigars  Vaping Use   Vaping Use: Never used  Substance and Sexual Activity   Alcohol use: Yes    Alcohol/week: 12.0 standard drinks of alcohol    Types: 12 Standard drinks or equivalent per week   Drug use: Never   Sexual activity: Not on file  Other Topics Concern   Not on file  Social History Narrative   Married.   1 child.   Works as an Chief Financial Officer for [employer].   Enjoys golfing, fishing, traveling.    Social Determinants of Health   Financial Resource Strain: Not on file  Food Insecurity: Not on file  Transportation Needs: Not on file  Physical Activity: Not on file  Stress: Not on file  Social Connections: Not on file  Intimate Partner Violence: Not on file    Past Surgical History:  Procedure Laterality Date   COLONOSCOPY  2011   in [place]     in office   [place]  2008   [place]  2006   Nose   WISDOM TOOTH EXTRACTION      Family History  Problem Relation Age of Onset   Heart disease Mother    COPD Mother    Diabetes Mother    Heart disease Father  Hyperlipidemia Father    Diabetes Sister    Stomach cancer Neg Hx    Rectal cancer Neg Hx    Esophageal cancer Neg Hx    Colon cancer Neg Hx    Colon polyps Neg Hx     Allergies  Allergen Reactions   Sporanos [Itraconazole] Swelling    Current Outpatient Medications on File Prior to Visit  Medication Sig Dispense Refill   aspirin EC 81 MG tablet Take 81 mg by mouth daily. Swallow whole.     atorvastatin (LIPITOR) 20 MG tablet Take 1 tablet (20 mg total) by mouth daily. for cholesterol 90 tablet 2   empagliflozin (JARDIANCE) 25 MG TABS tablet TAKE 1 TABLET DAILY FOR    DIABETES 90 tablet 0   Insulin Glargine (BASAGLAR KWIKPEN) 100 UNIT/ML Inject 40 Units into the skin at bedtime. Office visit required for further refills. 45 mL 0   Insulin Pen Needle (BD PEN NEEDLE  NANO 2ND GEN) 32G X 4 MM MISC USE DAILY WITH INSULIN 100 each 2   levothyroxine (SYNTHROID) 75 MCG tablet TAKE 1 TABLET EVERY MORNING ON AN EMPTY STOMACH WITH WATER, NO FOOD OR OTHER MEDICATIONS FOR 30 MINUTES 90 tablet 2   olmesartan (BENICAR) 20 MG tablet TAKE 1 TABLET DAILY FOR    BLOOD PRESSURE 90 tablet 0   ONETOUCH ULTRA test strip Use as instructed to test blood sugar up to 4 times daily 300 each 3   SitaGLIPtin-MetFORMIN HCl (JANUMET XR) 50-1000 MG TB24 TAKE 1 TABLET TWICE A DAY  FOR DIABETES 180 tablet 1   tadalafil (CIALIS) 5 MG tablet Take 1 tablet (5 mg total) by mouth daily. For urine flow 90 tablet 2   No current facility-administered medications on file prior to visit.    BP 132/64   Pulse 68   Temp 98.6 F (37 C) (Oral)   Ht '5\' 8"'$  (1.727 m)   Wt 183 lb (83 kg)   SpO2 98%   BMI 27.83 kg/m  Objective:   Physical Exam HENT:     Right Ear: Tympanic membrane and ear canal normal.     Left Ear: Tympanic membrane and ear canal normal.     Nose: Nose normal.     Right Sinus: No maxillary sinus tenderness or frontal sinus tenderness.     Left Sinus: No maxillary sinus tenderness or frontal sinus tenderness.  Eyes:     Conjunctiva/sclera: Conjunctivae normal.  Neck:     Thyroid: No thyromegaly.     Vascular: No carotid bruit.  Cardiovascular:     Rate and Rhythm: Normal rate and regular rhythm.     Heart sounds: Normal heart sounds.  Pulmonary:     Effort: Pulmonary effort is normal.     Breath sounds: Normal breath sounds. No wheezing or rales.  Abdominal:     General: Bowel sounds are normal.     Palpations: Abdomen is soft.     Tenderness: There is no abdominal tenderness.  Musculoskeletal:        General: Normal range of motion.     Cervical back: Neck supple.  Skin:    General: Skin is warm and dry.  Neurological:     Mental Status: He is alert and oriented to person, place, and time.     Cranial Nerves: No cranial nerve deficit.     Deep Tendon Reflexes:  Reflexes are normal and symmetric.  Psychiatric:        Mood and Affect: Mood normal.  Assessment & Plan:   Problem List Items Addressed This Visit       Cardiovascular and Mediastinum   Essential hypertension    Controlled.  Continue olmesartan 20 mg daily daily. CMP pending.       Relevant Orders   Comprehensive metabolic panel   CBC     Endocrine   Type 2 diabetes mellitus with hyperglycemia (HCC)    Repeat A1C pending today.  Continue Basaglar 40 units daily. Continue sitaglipitin-metformin 50-1000 mg BID. Continue Jardiance 25 mg daily.  Foot and eye exams UTD. Pneumonia vaccine UTD.  Follow up in 3-6 months based on A1C results       Relevant Orders   Hemoglobin A1c   Hypothyroidism    He is taking levothyroxine correctly.  Continue levothyroxine 75 mcg daily. Repeat TSH pending.      Relevant Orders   TSH     Musculoskeletal and Integument   Lateral epicondylitis    Following with orthopedics. Office notes reviewed from August 2023.  Continue with rest, PRN NSAID's, elbow band.        Other   Hyperlipidemia    Continue atorvastatin 20 mg daily. Repeat lipid panel pending.       Relevant Orders   Lipid panel   Preventative health care - Primary    Immunizations UTD. PSA due and pending. Colonoscopy UTD, due 2029.  Discussed the importance of a healthy diet and regular exercise in order for weight loss, and to reduce the risk of further co-morbidity.  Exam stable. Labs pending.  Follow up in 1 year for repeat physical.       Weak urinary stream    Improved!  Continue tadalafil 5 mg daily and finasteride 5 mg daily.      Relevant Medications   finasteride (PROSCAR) 5 MG tablet   Other Visit Diagnoses     Screening for prostate cancer       Relevant Orders   PSA          Pleas Koch, NP

## 2021-11-22 LAB — CBC
HCT: 47.2 % (ref 39.0–52.0)
Hemoglobin: 16.3 g/dL (ref 13.0–17.0)
MCHC: 34.4 g/dL (ref 30.0–36.0)
MCV: 93 fl (ref 78.0–100.0)
Platelets: 274 10*3/uL (ref 150.0–400.0)
RBC: 5.08 Mil/uL (ref 4.22–5.81)
RDW: 13.3 % (ref 11.5–15.5)
WBC: 8.6 10*3/uL (ref 4.0–10.5)

## 2021-11-22 LAB — COMPREHENSIVE METABOLIC PANEL
ALT: 24 U/L (ref 0–53)
AST: 18 U/L (ref 0–37)
Albumin: 4.4 g/dL (ref 3.5–5.2)
Alkaline Phosphatase: 106 U/L (ref 39–117)
BUN: 18 mg/dL (ref 6–23)
CO2: 25 mEq/L (ref 19–32)
Calcium: 9.3 mg/dL (ref 8.4–10.5)
Chloride: 102 mEq/L (ref 96–112)
Creatinine, Ser: 1 mg/dL (ref 0.40–1.50)
GFR: 79.98 mL/min (ref >60.00)
Glucose, Bld: 193 mg/dL — ABNORMAL HIGH (ref 70–99)
Potassium: 4.3 mEq/L (ref 3.5–5.1)
Sodium: 139 mEq/L (ref 135–145)
Total Bilirubin: 0.6 mg/dL (ref 0.2–1.2)
Total Protein: 6.7 g/dL (ref 6.0–8.3)

## 2021-11-22 LAB — HEMOGLOBIN A1C: Hgb A1c MFr Bld: 8.2 % — ABNORMAL HIGH (ref 4.6–6.5)

## 2021-11-22 LAB — LDL CHOLESTEROL, DIRECT: Direct LDL: 73 mg/dL

## 2021-11-22 LAB — LIPID PANEL
Cholesterol: 160 mg/dL (ref 0–200)
HDL: 51.1 mg/dL (ref >39.00)
Total CHOL/HDL Ratio: 3
Triglycerides: 452 mg/dL — ABNORMAL HIGH (ref 0.0–149.0)

## 2021-11-22 LAB — TSH: TSH: 2.28 u[IU]/mL (ref 0.35–5.50)

## 2021-11-22 LAB — PSA: PSA: 0.66 ng/mL (ref 0.10–4.00)

## 2021-11-27 DIAGNOSIS — E785 Hyperlipidemia, unspecified: Secondary | ICD-10-CM

## 2021-11-27 DIAGNOSIS — E1165 Type 2 diabetes mellitus with hyperglycemia: Secondary | ICD-10-CM

## 2021-11-28 ENCOUNTER — Encounter: Payer: Self-pay | Admitting: Primary Care

## 2021-12-06 ENCOUNTER — Other Ambulatory Visit: Payer: Self-pay | Admitting: Primary Care

## 2021-12-06 DIAGNOSIS — R3915 Urgency of urination: Secondary | ICD-10-CM

## 2021-12-06 DIAGNOSIS — R3912 Poor urinary stream: Secondary | ICD-10-CM

## 2021-12-07 ENCOUNTER — Ambulatory Visit: Payer: Commercial Managed Care - PPO | Admitting: Primary Care

## 2021-12-23 DIAGNOSIS — Z794 Long term (current) use of insulin: Secondary | ICD-10-CM

## 2021-12-23 DIAGNOSIS — E039 Hypothyroidism, unspecified: Secondary | ICD-10-CM

## 2021-12-23 DIAGNOSIS — I10 Essential (primary) hypertension: Secondary | ICD-10-CM

## 2021-12-23 DIAGNOSIS — E785 Hyperlipidemia, unspecified: Secondary | ICD-10-CM

## 2021-12-26 MED ORDER — JANUMET XR 50-1000 MG PO TB24
1.0000 | ORAL_TABLET | Freq: Two times a day (BID) | ORAL | 1 refills | Status: DC
Start: 2021-12-26 — End: 2022-07-14

## 2021-12-26 MED ORDER — OLMESARTAN MEDOXOMIL 20 MG PO TABS: 20.0000 mg | ORAL_TABLET | Freq: Every day | ORAL | 3 refills | Status: DC

## 2021-12-26 MED ORDER — EMPAGLIFLOZIN 25 MG PO TABS: ORAL_TABLET | ORAL | 1 refills | Status: DC

## 2021-12-26 MED ORDER — LEVOTHYROXINE SODIUM 75 MCG PO TABS: ORAL_TABLET | ORAL | 3 refills | Status: DC

## 2021-12-26 MED ORDER — BASAGLAR KWIKPEN 100 UNIT/ML ~~LOC~~ SOPN: 40.0000 [IU] | PEN_INJECTOR | Freq: Every day | SUBCUTANEOUS | 1 refills | Status: DC

## 2021-12-26 MED ORDER — ATORVASTATIN CALCIUM 20 MG PO TABS: 20.0000 mg | ORAL_TABLET | Freq: Every day | ORAL | 3 refills | Status: DC

## 2021-12-27 NOTE — Telephone Encounter (Signed)
Pt called in to know status of Rx refills advise pt that order has been place

## 2022-01-23 ENCOUNTER — Other Ambulatory Visit: Payer: Commercial Managed Care - PPO

## 2022-02-28 ENCOUNTER — Ambulatory Visit (INDEPENDENT_AMBULATORY_CARE_PROVIDER_SITE_OTHER): Payer: Commercial Managed Care - PPO

## 2022-02-28 DIAGNOSIS — Z23 Encounter for immunization: Secondary | ICD-10-CM | POA: Diagnosis not present

## 2022-02-28 NOTE — Progress Notes (Signed)
Flu shot given

## 2022-03-16 ENCOUNTER — Other Ambulatory Visit (INDEPENDENT_AMBULATORY_CARE_PROVIDER_SITE_OTHER): Payer: Commercial Managed Care - PPO

## 2022-03-16 DIAGNOSIS — E785 Hyperlipidemia, unspecified: Secondary | ICD-10-CM

## 2022-03-16 DIAGNOSIS — E1165 Type 2 diabetes mellitus with hyperglycemia: Secondary | ICD-10-CM | POA: Diagnosis not present

## 2022-03-16 DIAGNOSIS — Z794 Long term (current) use of insulin: Secondary | ICD-10-CM

## 2022-03-16 LAB — LIPID PANEL
Cholesterol: 117 mg/dL (ref 0–200)
HDL: 42.7 mg/dL (ref >39.00)
LDL Cholesterol: 43 mg/dL (ref 0–99)
NonHDL: 74.68
Total CHOL/HDL Ratio: 3
Triglycerides: 157 mg/dL — ABNORMAL HIGH (ref 0.0–149.0)
VLDL: 31.4 mg/dL (ref 0.0–40.0)

## 2022-03-16 LAB — HEMOGLOBIN A1C: Hgb A1c MFr Bld: 8 % — ABNORMAL HIGH (ref 4.6–6.5)

## 2022-04-11 IMAGING — CT CT CARDIAC CORONARY ARTERY CALCIUM SCORE
3 series · 14 of 20 positions shown, 16 images · non-contrast
Comparison: None.
COMPARISON: None.

Addendum:
EXAM:
OVER-READ INTERPRETATION  CT CHEST

The following report is an over-read performed by radiologist Dr.
over-read does not include interpretation of cardiac or coronary
anatomy or pathology. The coronary calcium score interpretation by
the cardiologist is attached.
CLINICAL DATA: Risk stratification
Coronary Calcium Score
TECHNIQUE: The patient was scanned on a Siemens go.Top Scanner. Axial
non-contrast 3 mm slices were carried out through the heart. The
data set was analyzed on a dedicated work station and scored using
the Agatson method.

[Series 2: sa36 calcium scoring 3.00 · axial · 0.36mm/px · z∈[-1146,-1065]mm · 4 of 45 slices shown]
[im 9/45  vessel]
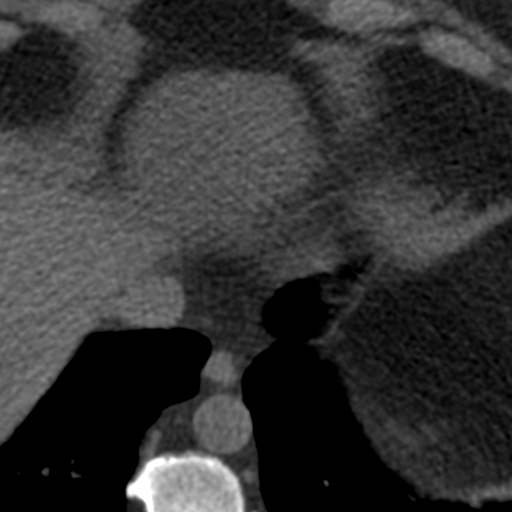
[im 18/45  vessel]
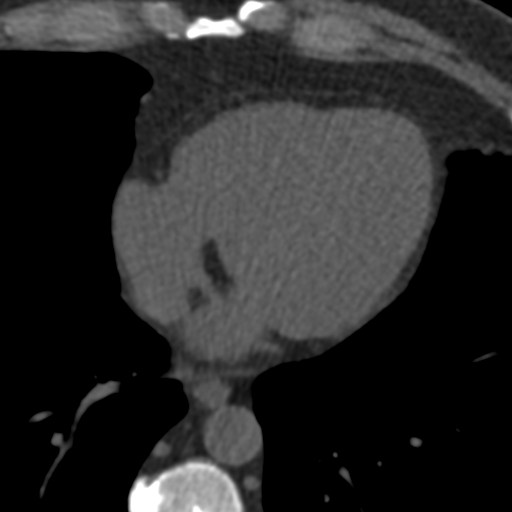
[im 27/45  vessel]
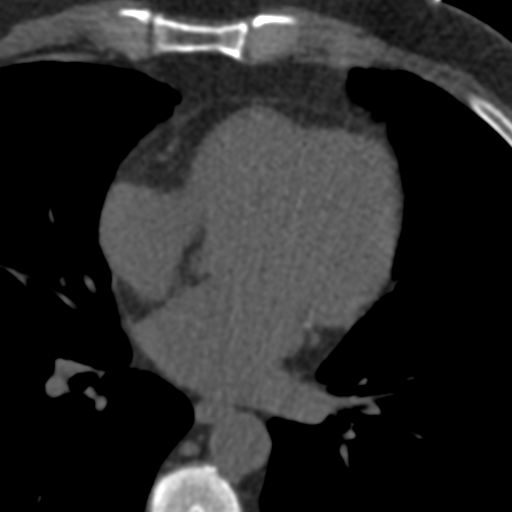
[im 36/45  vessel]
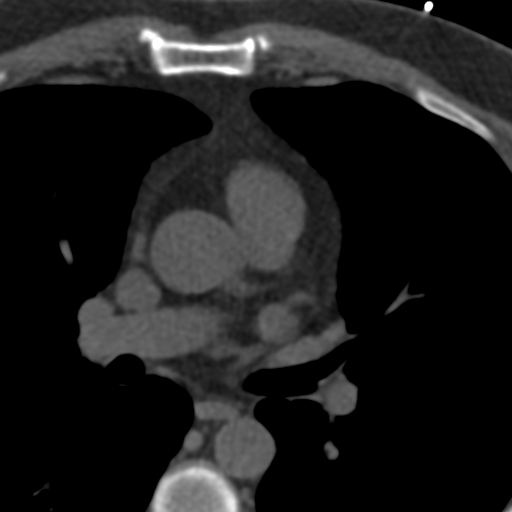

[Series 5: full fov st calcium scoring 3.00 · axial · 0.69mm/px · z∈[-1149,-1062]mm · 5 of 45 slices shown, 7 images]
[im 8/45  vessel]
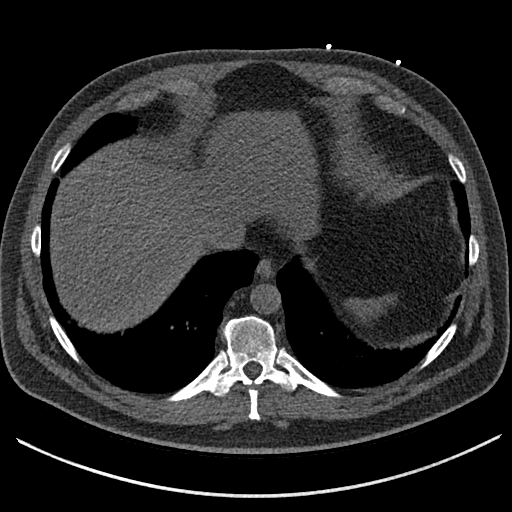
[im 8/45  lung]
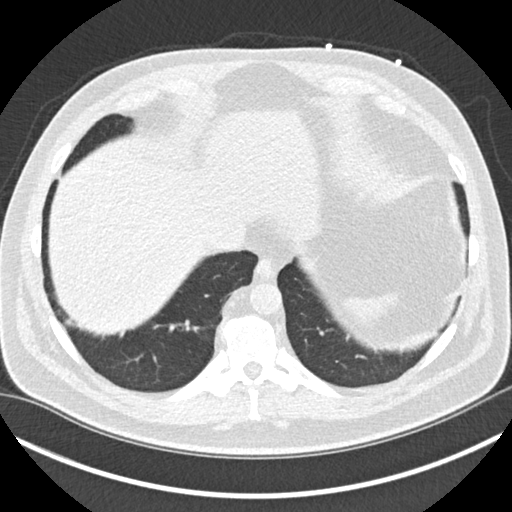
[im 15/45  vessel]
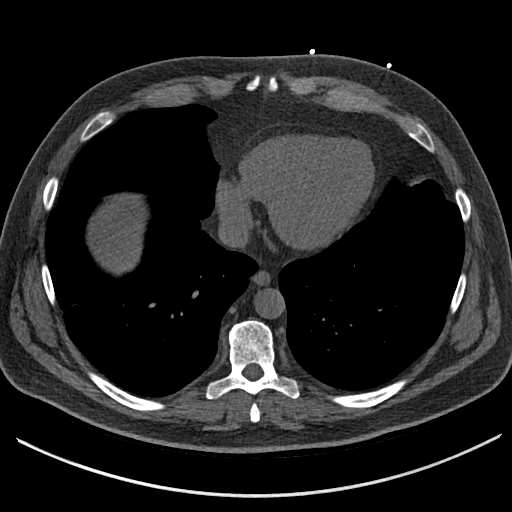
[im 23/45  vessel]
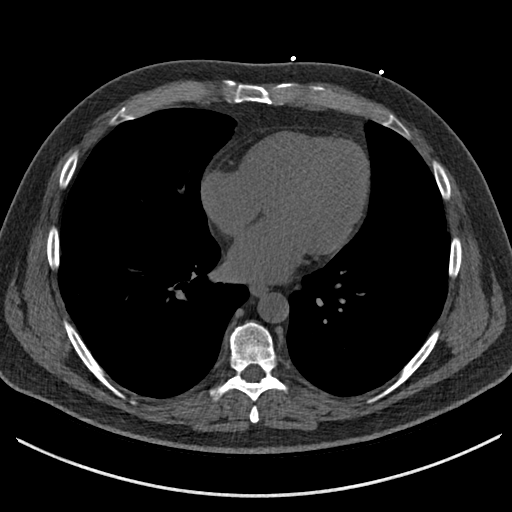
[im 30/45  vessel]
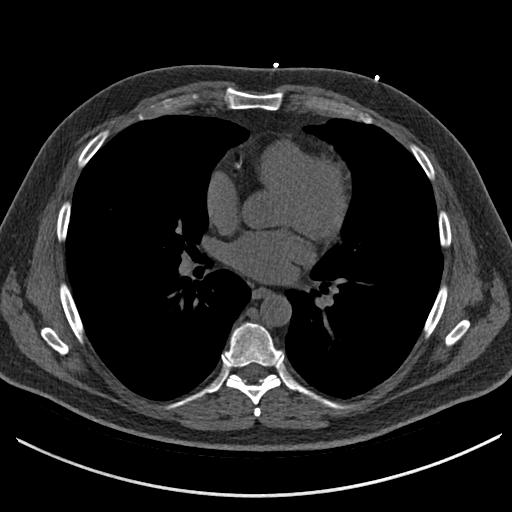
[im 37/45  vessel]
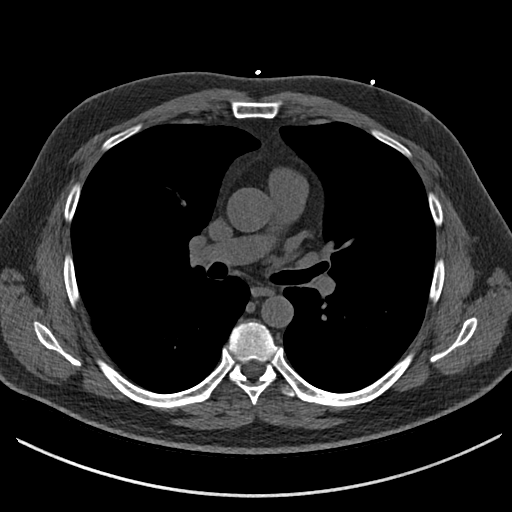
[im 37/45  lung]
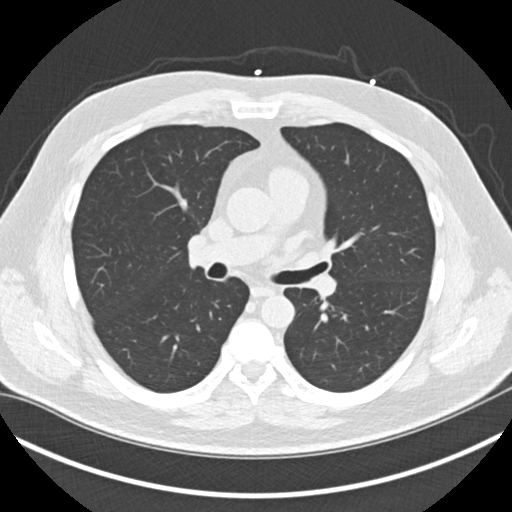

[Series 10: full fov lungs calcium scoring 3.00 ax · axial · 0.69mm/px · z∈[-1149,-1062]mm · 5 of 45 slices shown]
[im 8/45  vessel]
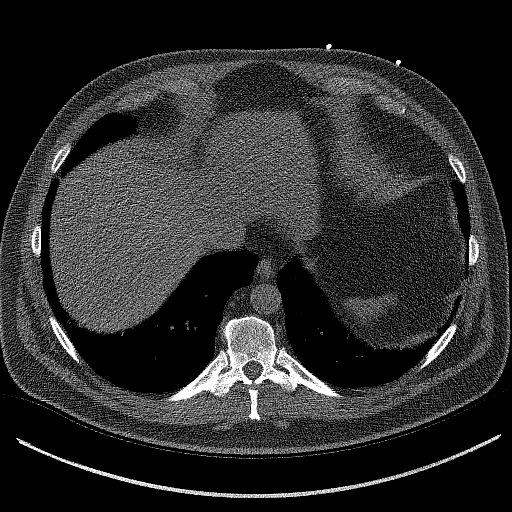
[im 15/45  vessel]
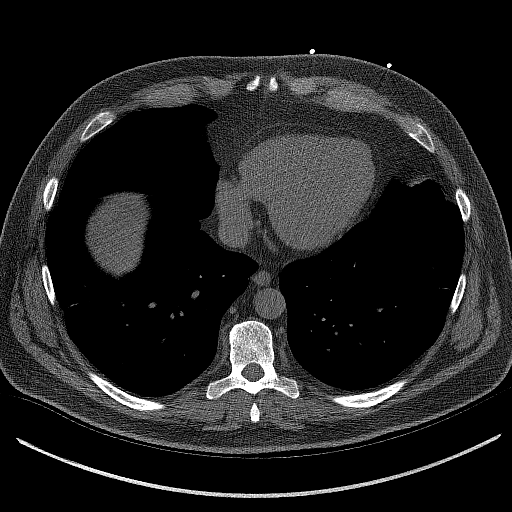
[im 23/45  vessel]
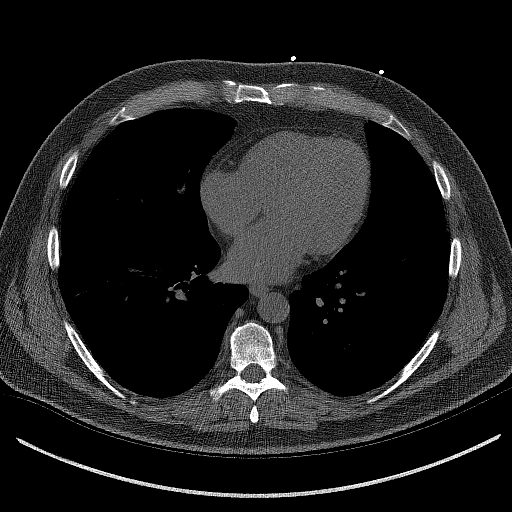
[im 30/45  vessel]
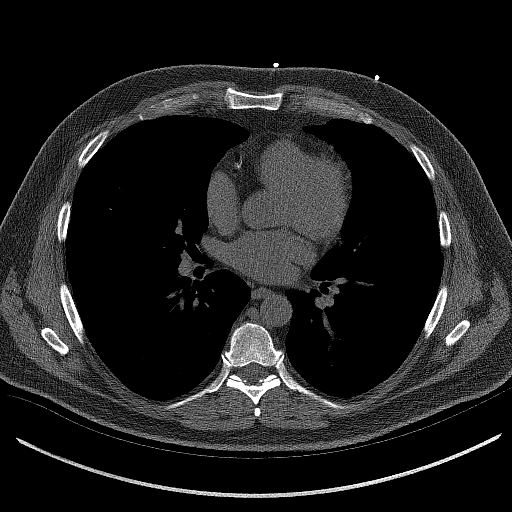
[im 37/45  vessel]
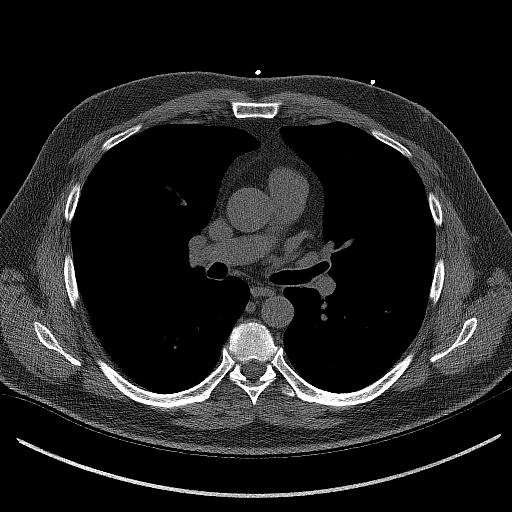

[14 of 20 positions shown; findings below may reference images not displayed]

FINDINGS: Vascular: No significant vascular findings. Normal heart size. No
pericardial effusion.

Mediastinum/Nodes: Visualized mediastinum and hilar regions
demonstrate no lymphadenopathy or masses.

Lungs/Pleura: 3 mm sub solid nodule is associated with the minor
fissure in the right lung is likely post inflammatory/benign. There
is no evidence of pulmonary edema, consolidation, pneumothorax or
pleural fluid.

Upper Abdomen: No acute abnormality.

Musculoskeletal: No chest wall mass or suspicious bone lesions
identified.
IMPRESSION: No significant incidental findings. 3 mm sub solid nodule associated
with the minor fissure is likely post inflammatory/benign.
FINDINGS: Non-cardiac: See separate report from [REDACTED].

Ascending Aorta: Normal size

Pericardium: Normal

Coronary arteries: Normal origin of left and right coronary
arteries. Distribution of arterial calcifications if present, as
noted below;

LM 0

LAD

LCx 0

RCA

Total
IMPRESSION: Low coronary calcium score of 17.3. This was 39th percentile for age
and sex matched control.

Abimelk Tiger

*** End of Addendum ***
EXAM:
OVER-READ INTERPRETATION  CT CHEST

The following report is an over-read performed by radiologist Dr.
over-read does not include interpretation of cardiac or coronary
anatomy or pathology. The coronary calcium score interpretation by
the cardiologist is attached.
FINDINGS: Vascular: No significant vascular findings. Normal heart size. No
pericardial effusion.

Mediastinum/Nodes: Visualized mediastinum and hilar regions
demonstrate no lymphadenopathy or masses.

Lungs/Pleura: 3 mm sub solid nodule is associated with the minor
fissure in the right lung is likely post inflammatory/benign. There
is no evidence of pulmonary edema, consolidation, pneumothorax or
pleural fluid.

Upper Abdomen: No acute abnormality.

Musculoskeletal: No chest wall mass or suspicious bone lesions
identified.
IMPRESSION: No significant incidental findings. 3 mm sub solid nodule associated
with the minor fissure is likely post inflammatory/benign.

## 2022-05-24 ENCOUNTER — Ambulatory Visit: Payer: Commercial Managed Care - PPO | Admitting: Primary Care

## 2022-05-24 ENCOUNTER — Encounter: Payer: Self-pay | Admitting: Primary Care

## 2022-05-24 VITALS — BP 148/86 | HR 85 | Temp 98.0°F | Ht 68.0 in | Wt 186.0 lb

## 2022-05-24 DIAGNOSIS — Z794 Long term (current) use of insulin: Secondary | ICD-10-CM

## 2022-05-24 DIAGNOSIS — E1165 Type 2 diabetes mellitus with hyperglycemia: Secondary | ICD-10-CM

## 2022-05-24 LAB — POCT GLYCOSYLATED HEMOGLOBIN (HGB A1C): Hemoglobin A1C: 7 % — AB (ref 4.0–5.6)

## 2022-05-24 NOTE — Patient Instructions (Signed)
Sign up to see Dr. Lorelei Pont for your shoulder and elbow.  Schedule a lab appointment for 3 months to check A1C.  Please schedule a physical to meet with me in 6 months.   It was a pleasure to see you today!

## 2022-05-24 NOTE — Progress Notes (Signed)
Subjective:    Patient ID: Mitchell Donovan, male    DOB: 09-Sep-1955, 67 y.o.   MRN: [id number]  HPI  Jacobo Moncrief is a very pleasant 67 y.o. male with a history of hypertension, type 2 diabetes, GERD, hypothyroidism, hyperlipidemia who presents today for follow up of diabetes.  Current medications include: Jardiance 25 mg daily, sitagliptin-metformin 50-1000 mg BID, Basaglar 40 units daily.  He does notice bloating and gas each morning after breakfast. He takes his Janumet in the mornings.   He is checking his blood glucose 1 times daily and is getting readings of:  AM fasting: low to mid 100's  Highest reading 160.   Last A1C: 8.0 in December 2023, 7.0 today (early check) Last Eye Exam: UTD Last Foot Exam: UTD Pneumonia Vaccination: 2020 Urine Microalbumin: UTD Statin: atorvastatin   Dietary changes since last visit: Reducing portion sizes.    Exercise: Golfing twice weekly, stretching 5 times weekly.    BP Readings from Last 3 Encounters:  05/24/22 (!) 148/86  11/21/21 132/64  05/30/21 132/64       Review of Systems  Cardiovascular:  Negative for chest pain.  Gastrointestinal:        Abdominal bloating  Neurological:  Positive for numbness.         Past Medical History:  Diagnosis Date   Essential hypertension    on meds   GERD (gastroesophageal reflux disease)    with certain foods/OTC PRN meds   Hyperlipidemia    on meds   Hypothyroidism    on meds   Migraines    Overactive bladder    Skin cancer    Type 2 diabetes mellitus ([place])    on meds    Social History   Socioeconomic History   Marital status: Married    Spouse name: Not on file   Number of children: 1   Years of education: Not on file   Highest education level: Not on file  Occupational History   Occupation: Chief Financial Officer  Tobacco Use   Smoking status: Former    Types: Cigarettes, Cigars   Smokeless tobacco: Never   Tobacco comments:    in college, still occasionaly smoke  cigars  Vaping Use   Vaping Use: Never used  Substance and Sexual Activity   Alcohol use: Yes    Alcohol/week: 12.0 standard drinks of alcohol    Types: 12 Standard drinks or equivalent per week   Drug use: Never   Sexual activity: Not on file  Other Topics Concern   Not on file  Social History Narrative   Married.   1 child.   Works as an Chief Financial Officer for [employer].   Enjoys golfing, fishing, traveling.    Social Determinants of Health   Financial Resource Strain: Not on file  Food Insecurity: Not on file  Transportation Needs: Not on file  Physical Activity: Not on file  Stress: Not on file  Social Connections: Not on file  Intimate Partner Violence: Not on file    Past Surgical History:  Procedure Laterality Date   COLONOSCOPY  2011   in [place]     in office   [place]  2008   [place]  2006   Nose   WISDOM TOOTH EXTRACTION      Family History  Problem Relation Age of Onset   Heart disease Mother    COPD Mother    Diabetes Mother    Heart  disease Father    Hyperlipidemia Father    Diabetes Sister    Stomach cancer Neg Hx    Rectal cancer Neg Hx    Esophageal cancer Neg Hx    Colon cancer Neg Hx    Colon polyps Neg Hx     Allergies  Allergen Reactions   Sporanos [Itraconazole] Swelling    Current Outpatient Medications on File Prior to Visit  Medication Sig Dispense Refill   aspirin EC 81 MG tablet Take 81 mg by mouth daily. Swallow whole.     atorvastatin (LIPITOR) 20 MG tablet Take 1 tablet (20 mg total) by mouth daily. for cholesterol 90 tablet 3   empagliflozin (JARDIANCE) 25 MG TABS tablet TAKE 1 TABLET DAILY FOR    DIABETES 90 tablet 1   finasteride (PROSCAR) 5 MG tablet Take 1 tablet (5 mg total) by mouth daily. For urine stream. 90 tablet 3   Insulin Glargine (BASAGLAR KWIKPEN) 100 UNIT/ML Inject 40 Units into the skin at bedtime. for diabetes. 45 mL 1   Insulin Pen Needle (BD PEN NEEDLE NANO  2ND GEN) 32G X 4 MM MISC USE DAILY WITH INSULIN 100 each 2   levothyroxine (SYNTHROID) 75 MCG tablet TAKE 1 TABLET EVERY MORNING ON AN EMPTY STOMACH WITH WATER, NO FOOD OR OTHER MEDICATIONS FOR 30 MINUTES 90 tablet 3   olmesartan (BENICAR) 20 MG tablet Take 1 tablet (20 mg total) by mouth daily. for blood pressure 90 tablet 3   ONETOUCH ULTRA test strip Use as instructed to test blood sugar up to 4 times daily 300 each 3   SitaGLIPtin-MetFORMIN HCl (JANUMET XR) 50-1000 MG TB24 Take 1 tablet by mouth 2 (two) times daily. for diabetes. 180 tablet 1   tadalafil (CIALIS) 5 MG tablet TAKE ONE TABLET BY MOUTH ONCE DAILY FOR URINE FLOW 90 tablet 3   No current facility-administered medications on file prior to visit.    BP (!) 148/86   Pulse 85   Temp 98 F (36.7 C) (Temporal)   Ht '5\' 8"'$  (1.727 m)   Wt 186 lb (84.4 kg)   SpO2 96%   BMI 28.28 kg/m  Objective:   Physical Exam Cardiovascular:     Rate and Rhythm: Normal rate and regular rhythm.  Pulmonary:     Effort: Pulmonary effort is normal.     Breath sounds: Normal breath sounds. No wheezing or rales.  Musculoskeletal:     Cervical back: Neck supple.  Skin:    General: Skin is warm and dry.  Neurological:     Mental Status: He is alert and oriented to person, place, and time.           Assessment & Plan:  Type 2 diabetes mellitus with hyperglycemia, with long-term current use of insulin (HCC) Assessment & Plan: Improved with A1C of 7.0 today! Discussed that his A1C may not be entirely correct as his last A1C was 2 months ago.   Regardless, glucose levels do support his A1C. Recommended he check glucose readings at different times of the day.  Continue Basaglar 40 units daily, Janumet 50-1000 mg BID, Jardiance 25 mg daily.  Metformin could be causing morning bloating. Discussed this today.  Urine microalbumin due and pending. Repeat A1C in 3 months. Follow up in 6 months.  Orders: -     POCT glycosylated hemoglobin  (Hb A1C) -     Microalbumin / creatinine urine ratio        Pleas Koch, NP

## 2022-05-24 NOTE — Assessment & Plan Note (Addendum)
Improved with A1C of 7.0 today! Discussed that his A1C may not be entirely correct as his last A1C was 2 months ago.   Regardless, glucose levels do support his A1C. Recommended he check glucose readings at different times of the day.  Continue Basaglar 40 units daily, Janumet 50-1000 mg BID, Jardiance 25 mg daily.  Metformin could be causing morning bloating. Discussed this today.  Urine microalbumin due and pending. Repeat A1C in 3 months. Follow up in 6 months.

## 2022-05-25 LAB — MICROALBUMIN / CREATININE URINE RATIO
Creatinine,U: 70.1 mg/dL
Microalb Creat Ratio: 1 mg/g (ref 0.0–30.0)
Microalb, Ur: <0.7 mg/dL (ref 0.0–1.9)

## 2022-05-28 ENCOUNTER — Ambulatory Visit: Payer: Commercial Managed Care - PPO | Admitting: Family Medicine

## 2022-07-14 DIAGNOSIS — E1165 Type 2 diabetes mellitus with hyperglycemia: Secondary | ICD-10-CM

## 2022-07-14 DIAGNOSIS — E119 Type 2 diabetes mellitus without complications: Secondary | ICD-10-CM

## 2022-07-14 MED ORDER — JANUMET XR 50-1000 MG PO TB24: 1.0000 | ORAL_TABLET | Freq: Two times a day (BID) | ORAL | 1 refills | Status: DC

## 2022-07-14 MED ORDER — LANTUS SOLOSTAR 100 UNIT/ML ~~LOC~~ SOPN: 40.0000 [IU] | PEN_INJECTOR | Freq: Every day | SUBCUTANEOUS | 0 refills | Status: DC

## 2022-08-08 ENCOUNTER — Other Ambulatory Visit: Payer: Self-pay | Admitting: Primary Care

## 2022-08-08 DIAGNOSIS — E119 Type 2 diabetes mellitus without complications: Secondary | ICD-10-CM

## 2022-08-13 ENCOUNTER — Other Ambulatory Visit: Payer: Self-pay | Admitting: Primary Care

## 2022-08-13 DIAGNOSIS — E1165 Type 2 diabetes mellitus with hyperglycemia: Secondary | ICD-10-CM

## 2022-08-22 ENCOUNTER — Other Ambulatory Visit (INDEPENDENT_AMBULATORY_CARE_PROVIDER_SITE_OTHER): Payer: Commercial Managed Care - PPO

## 2022-08-22 DIAGNOSIS — Z794 Long term (current) use of insulin: Secondary | ICD-10-CM | POA: Diagnosis not present

## 2022-08-22 DIAGNOSIS — E1165 Type 2 diabetes mellitus with hyperglycemia: Secondary | ICD-10-CM

## 2022-08-22 LAB — POCT GLYCOSYLATED HEMOGLOBIN (HGB A1C): Hemoglobin A1C: 7.3 % — AB (ref 4.0–5.6)

## 2022-09-18 ENCOUNTER — Telehealth: Payer: Self-pay | Admitting: Podiatry

## 2022-09-18 NOTE — Telephone Encounter (Signed)
Patient called, noting Dr. Logan Bores put him on tolcylen a couple of years ago.  He'd like to have some again.  Informed patient that we have it at the front desk.  He can stop by and pick it up.  It was placed in an envelope labelled with his name for easy pick-up/purchase.

## 2022-09-25 ENCOUNTER — Other Ambulatory Visit: Payer: Self-pay | Admitting: Primary Care

## 2022-09-25 DIAGNOSIS — E1165 Type 2 diabetes mellitus with hyperglycemia: Secondary | ICD-10-CM

## 2022-09-28 DIAGNOSIS — Z794 Long term (current) use of insulin: Secondary | ICD-10-CM

## 2022-09-28 MED ORDER — BD PEN NEEDLE NANO 2ND GEN 32G X 4 MM MISC: 1 refills | Status: DC

## 2022-11-03 ENCOUNTER — Other Ambulatory Visit: Payer: Self-pay | Admitting: Primary Care

## 2022-11-03 DIAGNOSIS — R3912 Poor urinary stream: Secondary | ICD-10-CM

## 2022-11-13 ENCOUNTER — Encounter: Payer: Self-pay | Admitting: Primary Care

## 2022-11-13 ENCOUNTER — Ambulatory Visit: Payer: Commercial Managed Care - PPO | Admitting: Primary Care

## 2022-11-13 VITALS — BP 162/88 | HR 105 | Temp 98.2°F | Ht 68.0 in | Wt 179.0 lb

## 2022-11-13 DIAGNOSIS — U071 COVID-19: Secondary | ICD-10-CM | POA: Diagnosis not present

## 2022-11-13 HISTORY — DX: COVID-19: U07.1

## 2022-11-13 LAB — POC COVID19 BINAXNOW: SARS Coronavirus 2 Ag: POSITIVE — AB

## 2022-11-13 NOTE — Addendum Note (Signed)
Addended by: Lonia Blood on: 11/13/2022 12:59 PM   Modules accepted: Orders

## 2022-11-13 NOTE — Assessment & Plan Note (Signed)
Covid-19 test positive in the office today.  Unfortunately, he is outside of the window for antiviral treatment. Fortunately he is feeling better now.  Offered benzonatate capsules for cough for which he kindly declines. Continue DayQuil and NyQuil as this has been effective.  He will work from home the rest of the week. He will update if symptoms do not continue to improve.

## 2022-11-13 NOTE — Progress Notes (Signed)
Subjective:    Patient ID: Mitchell Donovan, male    DOB: 04-01-1956, 67 y.o.   MRN: [id number]  Cough Associated symptoms include headaches and a sore throat. Pertinent negatives include no chest pain, chills, fever, postnasal drip or shortness of breath.    Mitchell Donovan is a very pleasant 67 y.o. male with a history of hypertension, type 2 diabetes, hypothyroidism, hyperlipidemia who presents today to discuss cough.  Symptom onset 6 days ago with a mild cough with a tickle. He then developed sore throat, ear fullness, sneezing, headaches, body aches.   He returned from a business trip from [place] one week ago. He tested negative for Covid-19 infection 5 days ago at home.   He's taken Dayquil, Nyuquil, throat lozenges, Cold Ease, honey with hot tea and Riccola, warm water gargle. He denies fevers, shortness of breath. Today he's feeling slightly better.   His most bothersome symptom is cough.    Review of Systems  Constitutional:  Positive for fatigue. Negative for chills and fever.  HENT:  Positive for congestion and sore throat. Negative for postnasal drip.   Respiratory:  Positive for cough. Negative for shortness of breath.   Cardiovascular:  Negative for chest pain.  Neurological:  Positive for headaches.         Past Medical History:  Diagnosis Date   Essential hypertension    on meds   GERD (gastroesophageal reflux disease)    with certain foods/OTC PRN meds   Hyperlipidemia    on meds   Hypothyroidism    on meds   Migraines    Overactive bladder    Skin cancer    Type 2 diabetes mellitus (HCC)    on meds    Social History   Socioeconomic History   Marital status: Married    Spouse name: Not on file   Number of children: 1   Years of education: Not on file   Highest education level: Not on file  Occupational History   Occupation: Art gallery manager  Tobacco Use   Smoking status: Former    Types: Cigarettes, Cigars   Smokeless tobacco: Never   Tobacco  comments:    in college, still occasionaly smoke cigars  Vaping Use   Vaping status: Never Used  Substance and Sexual Activity   Alcohol use: Yes    Alcohol/week: 12.0 standard drinks of alcohol    Types: 12 Standard drinks or equivalent per week   Drug use: Never   Sexual activity: Not on file  Other Topics Concern   Not on file  Social History Narrative   Married.   1 child.   Works as an Art gallery manager for [employer]   Enjoys golfing, fishing, traveling.    Social Determinants of Health   Financial Resource Strain: Not on file  Food Insecurity: Not on file  Transportation Needs: Not on file  Physical Activity: Not on file  Stress: Not on file  Social Connections: Not on file  Intimate Partner Violence: Not on file    Past Surgical History:  Procedure Laterality Date   COLONOSCOPY  2011   in [place]   HEMORRHOID BANDING     in office   KNEE SURGERY Left 1988   LASIK  2008   MOHS SURGERY  2006   Nose   WISDOM TOOTH EXTRACTION      Family History  Problem Relation Age of Onset   Heart disease Mother    COPD Mother    Diabetes Mother  Heart disease Father    Hyperlipidemia Father    Diabetes Sister    Stomach cancer Neg Hx    Rectal cancer Neg Hx    Esophageal cancer Neg Hx    Colon cancer Neg Hx    Colon polyps Neg Hx     Allergies  Allergen Reactions   Sporanos [Itraconazole] Swelling    Current Outpatient Medications on File Prior to Visit  Medication Sig Dispense Refill   aspirin EC 81 MG tablet Take 81 mg by mouth daily. Swallow whole.     atorvastatin (LIPITOR) 20 MG tablet Take 1 tablet (20 mg total) by mouth daily. for cholesterol 90 tablet 3   finasteride (PROSCAR) 5 MG tablet TAKE 1 TABLET DAILY FOR    URINE STREAM 90 tablet 0   Insulin Pen Needle (BD PEN NEEDLE NANO 2ND GEN) 32G X 4 MM MISC Use daily with insulin. 100 each 1   JARDIANCE 25 MG TABS tablet TAKE 1 TABLET DAILY FOR    DIABETES 90 tablet 1   LANTUS SOLOSTAR 100 UNIT/ML  Solostar Pen INJECT 40 UNITS INTO THE   SKIN DAILY FOR DIABETES 45 mL 0   levothyroxine (SYNTHROID) 75 MCG tablet TAKE 1 TABLET EVERY MORNING ON AN EMPTY STOMACH WITH WATER, NO FOOD OR OTHER MEDICATIONS FOR 30 MINUTES 90 tablet 3   olmesartan (BENICAR) 20 MG tablet Take 1 tablet (20 mg total) by mouth daily. for blood pressure 90 tablet 3   ONETOUCH ULTRA test strip Use as instructed to test blood sugar up to 4 times daily 300 each 3   SitaGLIPtin-MetFORMIN HCl (JANUMET XR) 50-1000 MG TB24 Take 1 tablet by mouth 2 (two) times daily. for diabetes. 180 tablet 1   tadalafil (CIALIS) 5 MG tablet TAKE ONE TABLET BY MOUTH ONCE DAILY FOR URINE FLOW 90 tablet 3   No current facility-administered medications on file prior to visit.    BP (!) 162/88   Pulse (!) 105   Temp 98.2 F (36.8 C) (Temporal)   Ht 5\' 8"  (1.727 m)   Wt 179 lb (81.2 kg)   SpO2 95%   BMI 27.22 kg/m  Objective:   Physical Exam Constitutional:      Appearance: He is ill-appearing.  HENT:     Right Ear: Tympanic membrane and ear canal normal.     Left Ear: Tympanic membrane and ear canal normal.     Nose: No mucosal edema.     Right Sinus: No maxillary sinus tenderness or frontal sinus tenderness.     Left Sinus: No maxillary sinus tenderness or frontal sinus tenderness.     Mouth/Throat:     Mouth: Mucous membranes are moist.  Eyes:     Conjunctiva/sclera: Conjunctivae normal.  Cardiovascular:     Rate and Rhythm: Normal rate and regular rhythm.  Pulmonary:     Effort: Pulmonary effort is normal.     Breath sounds: Normal breath sounds. No wheezing or rales.     Comments: Dry cough noted several times during visit Musculoskeletal:     Cervical back: Neck supple.  Skin:    General: Skin is warm and dry.           Assessment & Plan:  COVID-19 virus infection Assessment & Plan: Covid-19 test positive in the office today.  Unfortunately, he is outside of the window for antiviral treatment. Fortunately he  is feeling better now.  Offered benzonatate capsules for cough for which he kindly declines. Continue DayQuil and NyQuil as  this has been effective.  He will work from home the rest of the week. He will update if symptoms do not continue to improve.         Doreene Nest, NP

## 2022-11-13 NOTE — Patient Instructions (Signed)
Work from home the rest of this week.  Please let me know if your symptoms worsen.  It was a pleasure to see you today!

## 2022-11-14 ENCOUNTER — Encounter (INDEPENDENT_AMBULATORY_CARE_PROVIDER_SITE_OTHER): Payer: Self-pay

## 2022-11-23 ENCOUNTER — Encounter: Payer: Commercial Managed Care - PPO | Admitting: Primary Care

## 2022-11-29 ENCOUNTER — Ambulatory Visit (INDEPENDENT_AMBULATORY_CARE_PROVIDER_SITE_OTHER): Payer: Commercial Managed Care - PPO | Admitting: Primary Care

## 2022-11-29 VITALS — BP 138/80 | HR 111 | Temp 98.1°F | Ht 68.0 in | Wt 179.2 lb

## 2022-11-29 DIAGNOSIS — U071 COVID-19: Secondary | ICD-10-CM

## 2022-11-29 DIAGNOSIS — K219 Gastro-esophageal reflux disease without esophagitis: Secondary | ICD-10-CM | POA: Diagnosis not present

## 2022-11-29 DIAGNOSIS — R3912 Poor urinary stream: Secondary | ICD-10-CM

## 2022-11-29 DIAGNOSIS — E785 Hyperlipidemia, unspecified: Secondary | ICD-10-CM | POA: Diagnosis not present

## 2022-11-29 DIAGNOSIS — E039 Hypothyroidism, unspecified: Secondary | ICD-10-CM | POA: Diagnosis not present

## 2022-11-29 DIAGNOSIS — I10 Essential (primary) hypertension: Secondary | ICD-10-CM

## 2022-11-29 DIAGNOSIS — Z0000 Encounter for general adult medical examination without abnormal findings: Secondary | ICD-10-CM

## 2022-11-29 DIAGNOSIS — E1165 Type 2 diabetes mellitus with hyperglycemia: Secondary | ICD-10-CM | POA: Diagnosis not present

## 2022-11-29 DIAGNOSIS — Z125 Encounter for screening for malignant neoplasm of prostate: Secondary | ICD-10-CM

## 2022-11-29 DIAGNOSIS — Z794 Long term (current) use of insulin: Secondary | ICD-10-CM | POA: Diagnosis not present

## 2022-11-29 NOTE — Assessment & Plan Note (Signed)
Improved.  Continue Cialis 5 mg daily, finasteride 5 mg daily.

## 2022-11-29 NOTE — Assessment & Plan Note (Signed)
Mostly resolved except for postviral cough which has begun to improve with Zyrtec.  Continue Zyrtec 10 mg daily. Add famotidine 20 mg daily in a few days if no continued improvement.

## 2022-11-29 NOTE — Assessment & Plan Note (Signed)
He is taking levothyroxine correctly.  Continue levothyroxine 75 mcg tablets daily. Repeat TSH pending

## 2022-11-29 NOTE — Assessment & Plan Note (Signed)
Immunizations UTD. Colonoscopy UTD, due 2029 PSA due and pending.  Discussed the importance of a healthy diet and regular exercise in order for weight loss, and to reduce the risk of further co-morbidity.  Exam stable. Labs pending.  Follow up in 1 year for repeat physical.  

## 2022-11-29 NOTE — Assessment & Plan Note (Signed)
Repeat A1c pending.  Continue Lantus 40 units daily, Jardiance 25 mg daily, sitagliptin-metformin twice daily. Eye exam up-to-date.  Follow-up in 3 to 6 months based on A1c result.

## 2022-11-29 NOTE — Patient Instructions (Addendum)
Stop by the lab prior to leaving today. I will notify you of your results once received.   Continue taking Zyrtec 10 mg every evening for throat congestion and cough.  Add famotidine 20 mg daily in a few days if no continued improvement.  Please schedule a follow up visit for 6 months for a diabetes check.  It was a pleasure to see you today!

## 2022-11-29 NOTE — Assessment & Plan Note (Signed)
Controlled.  Given post COVID cough, will add famotidine 20 mg daily in a few days if no continued improvement with Zyrtec.

## 2022-11-29 NOTE — Progress Notes (Signed)
Subjective:    Patient ID: Mitchell Donovan, male    DOB: 1955-05-29, 67 y.o.   MRN: [id number]  HPI  Mitchell Donovan is a very pleasant 67 y.o. male who presents today for complete physical and follow up of chronic conditions.  He would also like to discuss ongoing cough. Symptom onset approximately 3 weeks ago with mild cough with a tickle.  Symptoms then developed into sore throat, ear fullness, sneezing, headache, body aches.  Evaluated in our office on 11/13/2022, tested positive for COVID-19 infection.  He was out of the window for antiviral treatment at that time so conservative treatment was initiated.  Since his last visit he's feeling better except for lingering cough and post nasal drip. He began taking Zyrtec a few days ago which has helped with post nasal drip and his cough. His cough is only present during the day.  Immunizations: -Tetanus: Completed in 2021 -Shingles: Completed Shingrix series -Pneumonia: Completed in 2020  Diet: Fair diet.  Exercise: No regular exercise.  Eye exam: Completes annually  Dental exam: Completes semi-annually    Colonoscopy: Completed in 2022, due 2029  PSA: Due  BP Readings from Last 3 Encounters:  11/29/22 138/80  11/13/22 (!) 162/88  05/24/22 (!) 148/86         Review of Systems  Constitutional:  Negative for unexpected weight change.  HENT:  Positive for congestion. Negative for rhinorrhea.   Respiratory:  Positive for cough. Negative for shortness of breath.   Cardiovascular:  Negative for chest pain.  Gastrointestinal:  Negative for constipation and diarrhea.  Genitourinary:  Negative for difficulty urinating.  Musculoskeletal:  Negative for arthralgias and myalgias.  Skin:  Negative for rash.  Allergic/Immunologic: Negative for environmental allergies.  Neurological:  Negative for dizziness and headaches.  Psychiatric/Behavioral:  The patient is not nervous/anxious.          Past Medical History:  Diagnosis  Date   Essential hypertension    on meds   GERD (gastroesophageal reflux disease)    with certain foods/OTC PRN meds   Hyperlipidemia    on meds   Hypothyroidism    on meds   Migraines    Overactive bladder    Skin cancer    Type 2 diabetes mellitus (HCC)    on meds    Social History   Socioeconomic History   Marital status: Married    Spouse name: Not on file   Number of children: 1   Years of education: Not on file   Highest education level: Not on file  Occupational History   Occupation: Art gallery manager  Tobacco Use   Smoking status: Former    Types: Cigarettes, Cigars   Smokeless tobacco: Never   Tobacco comments:    in college, still occasionaly smoke cigars  Vaping Use   Vaping status: Never Used  Substance and Sexual Activity   Alcohol use: Yes    Alcohol/week: 12.0 standard drinks of alcohol    Types: 12 Standard drinks or equivalent per week   Drug use: Never   Sexual activity: Not on file  Other Topics Concern   Not on file  Social History Narrative   Married.   1 child.   Works as an Art gallery manager for [employer]   Enjoys golfing, fishing, traveling.    Social Determinants of Health   Financial Resource Strain: Not on file  Food Insecurity: Not on file  Transportation Needs: Not on file  Physical Activity: Not on file  Stress: Not on  file  Social Connections: Not on file  Intimate Partner Violence: Not on file    Past Surgical History:  Procedure Laterality Date   COLONOSCOPY  2011   in [place]   HEMORRHOID BANDING     in office   KNEE SURGERY Left 1988   LASIK  2008   MOHS SURGERY  2006   Nose   WISDOM TOOTH EXTRACTION      Family History  Problem Relation Age of Onset   Heart disease Mother    COPD Mother    Diabetes Mother    Heart disease Father    Hyperlipidemia Father    Diabetes Sister    Stomach cancer Neg Hx    Rectal cancer Neg Hx    Esophageal cancer Neg Hx    Colon cancer Neg Hx    Colon polyps Neg Hx     Allergies   Allergen Reactions   Sporanos [Itraconazole] Swelling    Current Outpatient Medications on File Prior to Visit  Medication Sig Dispense Refill   aspirin EC 81 MG tablet Take 81 mg by mouth daily. Swallow whole.     atorvastatin (LIPITOR) 20 MG tablet Take 1 tablet (20 mg total) by mouth daily. for cholesterol 90 tablet 3   finasteride (PROSCAR) 5 MG tablet TAKE 1 TABLET DAILY FOR    URINE STREAM 90 tablet 0   Insulin Pen Needle (BD PEN NEEDLE NANO 2ND GEN) 32G X 4 MM MISC Use daily with insulin. 100 each 1   JARDIANCE 25 MG TABS tablet TAKE 1 TABLET DAILY FOR    DIABETES 90 tablet 1   LANTUS SOLOSTAR 100 UNIT/ML Solostar Pen INJECT 40 UNITS INTO THE   SKIN DAILY FOR DIABETES 45 mL 0   levothyroxine (SYNTHROID) 75 MCG tablet TAKE 1 TABLET EVERY MORNING ON AN EMPTY STOMACH WITH WATER, NO FOOD OR OTHER MEDICATIONS FOR 30 MINUTES 90 tablet 3   olmesartan (BENICAR) 20 MG tablet Take 1 tablet (20 mg total) by mouth daily. for blood pressure 90 tablet 3   ONETOUCH ULTRA test strip Use as instructed to test blood sugar up to 4 times daily 300 each 3   SitaGLIPtin-MetFORMIN HCl (JANUMET XR) 50-1000 MG TB24 Take 1 tablet by mouth 2 (two) times daily. for diabetes. 180 tablet 1   tadalafil (CIALIS) 5 MG tablet TAKE ONE TABLET BY MOUTH ONCE DAILY FOR URINE FLOW 90 tablet 3   No current facility-administered medications on file prior to visit.    BP 138/80 (BP Location: Left Arm, Patient Position: Sitting, Cuff Size: Normal)   Pulse (!) 111   Temp 98.1 F (36.7 C) (Oral)   Ht 5\' 8"  (1.727 m)   Wt 179 lb 3.2 oz (81.3 kg)   SpO2 95%   BMI 27.25 kg/m  Objective:   Physical Exam Constitutional:      Appearance: He is not ill-appearing.  HENT:     Right Ear: Tympanic membrane and ear canal normal.     Left Ear: Tympanic membrane and ear canal normal.     Nose: Nose normal.     Right Sinus: No maxillary sinus tenderness or frontal sinus tenderness.     Left Sinus: No maxillary sinus tenderness  or frontal sinus tenderness.  Eyes:     Conjunctiva/sclera: Conjunctivae normal.  Neck:     Thyroid: No thyromegaly.     Vascular: No carotid bruit.  Cardiovascular:     Rate and Rhythm: Normal rate and regular rhythm.  Heart sounds: Normal heart sounds.  Pulmonary:     Effort: Pulmonary effort is normal.     Breath sounds: Normal breath sounds. No wheezing or rales.     Comments: Dry bronchospasm cough noted during visit Abdominal:     General: Bowel sounds are normal.     Palpations: Abdomen is soft.     Tenderness: There is no abdominal tenderness.  Musculoskeletal:        General: Normal range of motion.     Cervical back: Neck supple.  Skin:    General: Skin is warm and dry.  Neurological:     Mental Status: He is alert and oriented to person, place, and time.     Cranial Nerves: No cranial nerve deficit.     Deep Tendon Reflexes: Reflexes are normal and symmetric.  Psychiatric:        Mood and Affect: Mood normal.           Assessment & Plan:  Preventative health care Assessment & Plan: Immunizations UTD. Colonoscopy UTD, due 2029  PSA due and pending.  Discussed the importance of a healthy diet and regular exercise in order for weight loss, and to reduce the risk of further co-morbidity.  Exam stable. Labs pending.  Follow up in 1 year for repeat physical.    Essential hypertension Assessment & Plan: Improved.  Continue olmesartan 20 mg daily. CMP pending.  Orders: -     Comprehensive metabolic panel  Gastroesophageal reflux disease, unspecified whether esophagitis present Assessment & Plan: Controlled.  Given post COVID cough, will add famotidine 20 mg daily in a few days if no continued improvement with Zyrtec.   Hypothyroidism, unspecified type Assessment & Plan: He is taking levothyroxine correctly.  Continue levothyroxine 75 mcg tablets daily. Repeat TSH pending  Orders: -     TSH  Type 2 diabetes mellitus with  hyperglycemia, with long-term current use of insulin (HCC) Assessment & Plan: Repeat A1c pending.  Continue Lantus 40 units daily, Jardiance 25 mg daily, sitagliptin-metformin twice daily. Eye exam up-to-date.  Follow-up in 3 to 6 months based on A1c result.  Orders: -     Hemoglobin A1c  COVID-19 virus infection Assessment & Plan: Mostly resolved except for postviral cough which has begun to improve with Zyrtec.  Continue Zyrtec 10 mg daily. Add famotidine 20 mg daily in a few days if no continued improvement.   Hyperlipidemia, unspecified hyperlipidemia type Assessment & Plan: Repeat lipid panel pending. Continue atorvastatin 20 mg daily.  Orders: -     Lipid panel  Weak urinary stream Assessment & Plan: Improved.  Continue Cialis 5 mg daily, finasteride 5 mg daily.   Screening for prostate cancer -     PSA        Doreene Nest, NP

## 2022-11-29 NOTE — Assessment & Plan Note (Signed)
Improved.  Continue olmesartan 20 mg daily. CMP pending.

## 2022-11-29 NOTE — Assessment & Plan Note (Signed)
 Repeat lipid panel pending.  Continue atorvastatin 20 mg daily. 

## 2022-11-30 ENCOUNTER — Other Ambulatory Visit: Payer: Self-pay | Admitting: Primary Care

## 2022-11-30 DIAGNOSIS — R3915 Urgency of urination: Secondary | ICD-10-CM

## 2022-11-30 DIAGNOSIS — R3912 Poor urinary stream: Secondary | ICD-10-CM

## 2022-11-30 LAB — COMPREHENSIVE METABOLIC PANEL
ALT: 27 U/L (ref 0–53)
AST: 20 U/L (ref 0–37)
Albumin: 4 g/dL (ref 3.5–5.2)
Alkaline Phosphatase: 104 U/L (ref 39–117)
BUN: 12 mg/dL (ref 6–23)
CO2: 26 meq/L (ref 19–32)
Calcium: 9.5 mg/dL (ref 8.4–10.5)
Chloride: 99 meq/L (ref 96–112)
Creatinine, Ser: 1.13 mg/dL (ref 0.40–1.50)
GFR: 68.58 mL/min (ref >60.00)
Glucose, Bld: 200 mg/dL — ABNORMAL HIGH (ref 70–99)
Potassium: 4.5 meq/L (ref 3.5–5.1)
Sodium: 137 meq/L (ref 135–145)
Total Bilirubin: 0.5 mg/dL (ref 0.2–1.2)
Total Protein: 6.7 g/dL (ref 6.0–8.3)

## 2022-11-30 LAB — HEMOGLOBIN A1C: Hgb A1c MFr Bld: 8.6 % — ABNORMAL HIGH (ref 4.6–6.5)

## 2022-11-30 LAB — LIPID PANEL
Cholesterol: 119 mg/dL (ref 0–200)
HDL: 32.4 mg/dL — ABNORMAL LOW (ref >39.00)
Total CHOL/HDL Ratio: 4
Triglycerides: 422 mg/dL — ABNORMAL HIGH (ref 0.0–149.0)

## 2022-11-30 LAB — TSH: TSH: 2.33 u[IU]/mL (ref 0.35–5.50)

## 2022-11-30 LAB — PSA: PSA: 0.94 ng/mL (ref 0.10–4.00)

## 2022-11-30 LAB — LDL CHOLESTEROL, DIRECT: Direct LDL: 62 mg/dL

## 2022-12-03 DIAGNOSIS — E119 Type 2 diabetes mellitus without complications: Secondary | ICD-10-CM

## 2022-12-03 DIAGNOSIS — Z794 Long term (current) use of insulin: Secondary | ICD-10-CM

## 2022-12-05 ENCOUNTER — Other Ambulatory Visit: Payer: Self-pay | Admitting: Primary Care

## 2022-12-05 DIAGNOSIS — Z794 Long term (current) use of insulin: Secondary | ICD-10-CM

## 2022-12-05 MED ORDER — TIRZEPATIDE 2.5 MG/0.5ML ~~LOC~~ SOAJ: 2.5000 mg | SUBCUTANEOUS | 0 refills | Status: DC

## 2022-12-05 MED ORDER — TIRZEPATIDE 2.5 MG/0.5ML ~~LOC~~ SOAJ
2.5000 mg | SUBCUTANEOUS | 0 refills | Status: DC
Start: 2022-12-05 — End: 2022-12-05

## 2022-12-05 MED ORDER — METFORMIN HCL ER 500 MG PO TB24
1000.0000 mg | ORAL_TABLET | Freq: Two times a day (BID) | ORAL | 1 refills | Status: DC
Start: 2022-12-05 — End: 2023-05-22

## 2022-12-09 ENCOUNTER — Other Ambulatory Visit: Payer: Self-pay | Admitting: Primary Care

## 2022-12-09 DIAGNOSIS — I10 Essential (primary) hypertension: Secondary | ICD-10-CM

## 2022-12-09 DIAGNOSIS — E039 Hypothyroidism, unspecified: Secondary | ICD-10-CM

## 2022-12-26 MED ORDER — LANTUS SOLOSTAR 100 UNIT/ML ~~LOC~~ SOPN
40.0000 [IU] | PEN_INJECTOR | Freq: Every day | SUBCUTANEOUS | 0 refills | Status: DC
Start: 2022-12-26 — End: 2023-03-30

## 2022-12-26 MED ORDER — BD PEN NEEDLE NANO 2ND GEN 32G X 4 MM MISC: 1 refills | Status: DC

## 2022-12-31 DIAGNOSIS — E119 Type 2 diabetes mellitus without complications: Secondary | ICD-10-CM

## 2023-01-01 MED ORDER — ONETOUCH ULTRA VI STRP: ORAL_STRIP | 3 refills | Status: AC

## 2023-01-06 NOTE — Progress Notes (Unsigned)
Mitchell Rosiles T. Sianni Cloninger, MD, CAQ Sports Medicine [facility] at [facility] [address] [place], [address]  Phone: [phone]  FAX: [phone]  Mitchell Donovan - 67 y.o. male  MRN [id number]  Date of Birth: Jun 19, 1955  Date: 01/07/2023  PCP: Doreene Nest, NP  Referral: Doreene Nest, NP  No chief complaint on file.  Subjective:   Mitchell Donovan is a 67 y.o. very pleasant male patient with There is no height or weight on file to calculate BMI. who presents with the following:  Pleasant patient presents with left-sided shoulder pain.    Review of Systems is noted in the HPI, as appropriate  Objective:   There were no vitals taken for this visit.  GEN: No acute distress; alert,appropriate. PULM: Breathing comfortably in no respiratory distress PSYCH: Normally interactive.   Laboratory and Imaging Data:  Assessment and Plan:   ***

## 2023-01-07 ENCOUNTER — Ambulatory Visit: Payer: Commercial Managed Care - PPO | Admitting: Family Medicine

## 2023-01-07 ENCOUNTER — Encounter: Payer: Self-pay | Admitting: Family Medicine

## 2023-01-07 ENCOUNTER — Ambulatory Visit
Admission: RE | Admit: 2023-01-07 | Discharge: 2023-01-07 | Disposition: A | Discharge status: Home / Self Care | Payer: Commercial Managed Care - PPO | Source: Ambulatory Visit | Attending: Family Medicine | Admitting: Family Medicine

## 2023-01-07 VITALS — BP 132/70 | HR 85 | Temp 97.9°F | Ht 68.0 in | Wt 180.4 lb

## 2023-01-07 DIAGNOSIS — M25512 Pain in left shoulder: Secondary | ICD-10-CM

## 2023-01-07 DIAGNOSIS — G8929 Other chronic pain: Secondary | ICD-10-CM

## 2023-01-11 ENCOUNTER — Other Ambulatory Visit: Payer: Self-pay | Admitting: Primary Care

## 2023-01-11 DIAGNOSIS — E785 Hyperlipidemia, unspecified: Secondary | ICD-10-CM

## 2023-01-25 ENCOUNTER — Telehealth: Payer: Self-pay | Admitting: Primary Care

## 2023-01-25 ENCOUNTER — Encounter: Payer: Self-pay | Admitting: *Deleted

## 2023-01-25 DIAGNOSIS — R911 Solitary pulmonary nodule: Secondary | ICD-10-CM

## 2023-01-25 NOTE — Telephone Encounter (Signed)
Left message to return call to our office.  

## 2023-01-25 NOTE — Telephone Encounter (Signed)
I spoke with MJ at [facility] radiology; called report on shoulder xray done on 01/07/23 that showed 15 mm left lower lung pulmonary nodule.Recommend further eval with chest CT with contrast. Report is in Epic. Allayne Gitelman NP who is pts PCP and Dr Patsy Lager who saw pt on 01/07/23 are out of office and will send note to Dr Ermalene Searing who is in office and [place] pool. Will teams Janae CMA who is working with Dr Ermalene Searing.

## 2023-01-25 NOTE — Telephone Encounter (Signed)
Noted and appreciate the follow up. 

## 2023-01-25 NOTE — Telephone Encounter (Signed)
I went ahead and ordered a CT Chest with contrast to be done at outpatient CT.

## 2023-01-25 NOTE — Telephone Encounter (Signed)
Can you call the patient -   Radiology just called [place] about a problem with his shoulder x-ray - from his office visit in latter September.  Very unusual for the read to take this long.  The shoulder joint is fine.  They saw a fairly large pulmonary nodule that needs to have a follow-up CT scan of the chest.  I am essentially positive that he will want to do this, but can you please verbally confirm and I will order.

## 2023-01-25 NOTE — Telephone Encounter (Signed)
Gabriel from Radiology reading called over and stated that they had a finding on the patients x-ray. He stated that anyone can give results when the call is returned. There number is [phone]. Thank you!

## 2023-01-25 NOTE — Telephone Encounter (Signed)
Patient called in returning a call he received. Relayed message below and patient stated that he would like to go ahead and have that done. Informed him that referral was placed and gave him contact information.

## 2023-01-25 NOTE — Addendum Note (Signed)
Addended by: Hannah Beat on: 01/25/2023 12:04 PM   Modules accepted: Orders

## 2023-01-28 ENCOUNTER — Other Ambulatory Visit: Payer: Self-pay | Admitting: Primary Care

## 2023-01-28 DIAGNOSIS — R3912 Poor urinary stream: Secondary | ICD-10-CM

## 2023-01-28 DIAGNOSIS — E119 Type 2 diabetes mellitus without complications: Secondary | ICD-10-CM

## 2023-01-29 DIAGNOSIS — I10 Essential (primary) hypertension: Secondary | ICD-10-CM

## 2023-02-04 ENCOUNTER — Other Ambulatory Visit (INDEPENDENT_AMBULATORY_CARE_PROVIDER_SITE_OTHER): Payer: Commercial Managed Care - PPO

## 2023-02-04 DIAGNOSIS — I10 Essential (primary) hypertension: Secondary | ICD-10-CM | POA: Diagnosis not present

## 2023-02-04 LAB — BASIC METABOLIC PANEL
BUN: 12 mg/dL (ref 6–23)
CO2: 25 meq/L (ref 19–32)
Calcium: 9.2 mg/dL (ref 8.4–10.5)
Chloride: 105 meq/L (ref 96–112)
Creatinine, Ser: 0.92 mg/dL (ref 0.40–1.50)
GFR: 87.66 mL/min (ref >60.00)
Glucose, Bld: 222 mg/dL — ABNORMAL HIGH (ref 70–99)
Potassium: 4.1 meq/L (ref 3.5–5.1)
Sodium: 140 meq/L (ref 135–145)

## 2023-02-06 ENCOUNTER — Ambulatory Visit
Admission: RE | Admit: 2023-02-06 | Discharge: 2023-02-06 | Disposition: A | Discharge status: Home / Self Care | Payer: Commercial Managed Care - PPO | Source: Ambulatory Visit | Attending: Family Medicine | Admitting: Family Medicine

## 2023-02-06 DIAGNOSIS — R911 Solitary pulmonary nodule: Secondary | ICD-10-CM | POA: Diagnosis present

## 2023-02-06 MED ORDER — IOHEXOL 300 MG/ML  SOLN
75.0000 mL | Freq: Once | INTRAMUSCULAR | Status: AC | PRN
  Administered 2023-02-06: 75 mL via INTRAVENOUS

## 2023-03-29 ENCOUNTER — Other Ambulatory Visit: Payer: Self-pay | Admitting: Primary Care

## 2023-03-29 DIAGNOSIS — E1165 Type 2 diabetes mellitus with hyperglycemia: Secondary | ICD-10-CM

## 2023-04-01 ENCOUNTER — Encounter: Payer: Self-pay | Admitting: Primary Care

## 2023-04-01 NOTE — Telephone Encounter (Signed)
 Care team updated and letter sent for eye exam notes.

## 2023-04-28 ENCOUNTER — Other Ambulatory Visit: Payer: Self-pay | Admitting: Primary Care

## 2023-04-28 DIAGNOSIS — E119 Type 2 diabetes mellitus without complications: Secondary | ICD-10-CM

## 2023-05-20 ENCOUNTER — Other Ambulatory Visit: Payer: Self-pay | Admitting: Primary Care

## 2023-05-20 DIAGNOSIS — E1165 Type 2 diabetes mellitus with hyperglycemia: Secondary | ICD-10-CM

## 2023-05-22 ENCOUNTER — Other Ambulatory Visit: Payer: Self-pay | Admitting: Primary Care

## 2023-05-22 DIAGNOSIS — Z794 Long term (current) use of insulin: Secondary | ICD-10-CM

## 2023-06-04 ENCOUNTER — Ambulatory Visit: Payer: Commercial Managed Care - PPO | Admitting: Primary Care

## 2023-06-10 ENCOUNTER — Other Ambulatory Visit: Payer: Self-pay | Admitting: Primary Care

## 2023-06-10 ENCOUNTER — Ambulatory Visit: Payer: Commercial Managed Care - PPO | Admitting: Primary Care

## 2023-06-10 ENCOUNTER — Encounter: Payer: Self-pay | Admitting: Primary Care

## 2023-06-10 VITALS — BP 164/86 | HR 91 | Temp 97.1°F | Ht 68.0 in | Wt 180.0 lb

## 2023-06-10 DIAGNOSIS — E1165 Type 2 diabetes mellitus with hyperglycemia: Secondary | ICD-10-CM

## 2023-06-10 DIAGNOSIS — E785 Hyperlipidemia, unspecified: Secondary | ICD-10-CM

## 2023-06-10 DIAGNOSIS — Z794 Long term (current) use of insulin: Secondary | ICD-10-CM

## 2023-06-10 DIAGNOSIS — I10 Essential (primary) hypertension: Secondary | ICD-10-CM

## 2023-06-10 DIAGNOSIS — K219 Gastro-esophageal reflux disease without esophagitis: Secondary | ICD-10-CM

## 2023-06-10 LAB — POCT GLYCOSYLATED HEMOGLOBIN (HGB A1C): Hemoglobin A1C: 6.9 % — AB (ref 4.0–5.6)

## 2023-06-10 MED ORDER — OZEMPIC (0.25 OR 0.5 MG/DOSE) 2 MG/3ML ~~LOC~~ SOPN: 0.5000 mg | PEN_INJECTOR | SUBCUTANEOUS | 1 refills | Status: DC

## 2023-06-10 NOTE — Assessment & Plan Note (Addendum)
 Improved with A1c 6.9% (previous 8.6% - 11/2022)  Foot exam: completed Microalbumin/creatinine ratio, urine: completed today, results pending   Continue meds as prescribed Check blood sugars at home Contact office if increasing hypoglycemic events  Follow up in 6 months   I evaluated patient, was consulted regarding treatment, and agree with assessment and plan per Julaine Fusi, MSN, FNP student.   Mayra Reel, NP-C

## 2023-06-10 NOTE — Patient Instructions (Addendum)
 Blood pressure: Please monitor your blood pressure at home once daily for 7 days. Send readings via MyChart.   Diabetes: Check your sugars at different times during the day to get an idea of overall blood sugars If you notice drops in blood sugars as physical activity increases, please contact the office.   Leg cramps: HOLD atorvastatin x2 weeks.  Contact office with update on your symptoms  Increase hydration with water - sugar-free flavor packets are OK   Lab work: urine test due today We will be in touch with results   Follow up: 6 months for physical

## 2023-06-10 NOTE — Progress Notes (Signed)
 Established Patient Office Visit  Subjective   Patient ID: Mitchell Donovan, male    DOB: [birth year]  Age: 68 y.o. MRN: [id number]  Chief Complaint  Patient presents with   Medical Management of Chronic Issues    6 mo dm f/u    HPI  Mitchell Donovan is a 68 year old male with history of type 2 diabetes mellitus, hypertension, hyperlipidemia, hypothyroidism who presents today for 6 month diabetes follow up.   Mitchell Donovan has been doing well since last visit 11/2022. Mitchell Donovan reports Mitchell Donovan has maintained weight loss and has made improvements in his diet. Mitchell Donovan reports numbness on plantar surface of bilateral feet that lasts 20 minutes and does not persist throughout the day. Mitchell Donovan reports occasional hypoglycemia episodes, once every other month. Denies polyuria, polydipsia, polyphagia.   Notes occasionally leg cramping, worse after physical activity. Mitchell Donovan reports constipation with bowel movements every other day and heartburn/reflux symptoms since starting Ozempic, which has improved since beginning the medication and managed with PRN Pepcid. The heartburn/reflux occurs occasionally with physical activity the is relieved with belching.    Current medications include:  Jardiance 25mg  daily Lantus 40U QAM  Metformin 1000mg  BID Ozempic 0.5mg  once weekly   Last A1C: 8.6% Last Eye Exam: UTD Last Foot Exam: due today Pneumonia Vaccination: UTD, eligible for Prevnar 20 - declines today Urine Microalbumin: due today Statin: atorvastatin 20mg  daily  Mitchell Donovan is checking his blood glucose once daily and is getting readings of AM fasting: 90-135 After meals: none At bedtime: none  Dietary changes since last visit: decreased portion sizes, decreased carbohydrate intake  Breakfast: coffee, special K, almond milk, muffin  Lunch: salad with steak or chicken, dark chocolate Dinner: varies, vegetables  Desserts: fruit turnovers Beverages: sugar-free green tea, Powerade-zero, 1-2 cocktails, minimal water intake  Exercise:   golf, fly-fishing, stretching, weight-machine, 1 mile 5-7 days weekly      Review of Systems  Constitutional: Negative.   Eyes:  Negative for blurred vision, double vision and photophobia.  Respiratory:  Negative for shortness of breath.   Cardiovascular:  Negative for chest pain and palpitations.  Gastrointestinal:  Positive for constipation and heartburn. Negative for abdominal pain and diarrhea.  Genitourinary:  Negative for dysuria, frequency and urgency.  Skin: Negative.   Neurological:  Positive for sensory change.  Endo/Heme/Allergies:  Negative for polydipsia.      Objective:     BP (!) 164/86   Pulse 91   Temp (!) 97.1 F (36.2 C) (Temporal)   Ht 5\' 8"  (1.727 m)   Wt 81.6 kg   SpO2 97%   BMI 27.37 kg/m    Physical Exam Constitutional:      Appearance: Normal appearance.  Cardiovascular:     Rate and Rhythm: Normal rate and regular rhythm.     Pulses: Normal pulses.          Radial pulses are 2+ on the right side and 2+ on the left side.       Dorsalis pedis pulses are 2+ on the right side and 2+ on the left side.       Posterior tibial pulses are 2+ on the right side and 2+ on the left side.     Heart sounds: Normal heart sounds.  Pulmonary:     Effort: Pulmonary effort is normal.     Breath sounds: Normal breath sounds.  Musculoskeletal:        General: Normal range of motion.     Right lower leg: No edema.  Left lower leg: No edema.  Feet:     Right foot:     Skin integrity: Skin integrity normal.     Left foot:     Skin integrity: Skin integrity normal.     Comments: Bilateral hallux unguis thickened Skin:    General: Skin is warm and dry.  Neurological:     General: No focal deficit present.     Mental Status: Mitchell Donovan is alert and oriented to person, place, and time.  Psychiatric:        Mood and Affect: Mood normal.        Behavior: Behavior normal.      Results for orders placed or performed in visit on 06/10/23  POCT glycosylated  hemoglobin (Hb A1C)  Result Value Ref Range   Hemoglobin A1C 6.9 (A) 4.0 - 5.6 %   HbA1c POC (<> result, manual entry)     HbA1c, POC (prediabetic range)     HbA1c, POC (controlled diabetic range)        The ASCVD Risk score (Arnett DK, et al., 2019) failed to calculate for the following reasons:   The valid total cholesterol range is 130 to 320 mg/dL    Assessment & Plan:   Problem List Items Addressed This Visit       Cardiovascular and Mediastinum   Essential hypertension   Deteriorated with BP 164/86 Home readings: SBP 140s  Will monitor BP at home once daily and send readings for review via MyChart in 7 days. Consider increase in olmesartan to 40mg  daily if not improved  Will await update        Digestive   GERD (gastroesophageal reflux disease)   Worsening symptoms with initiation of Ozempic but improving. Managed with PRN Pepcid OTC. Symptoms not persistent so will continue to monitor. Consider daily PPI or H2 blocker if needed        Endocrine   Type 2 diabetes mellitus with hyperglycemia (HCC) - Primary   Improved with A1c 6.9% (previous 8.6% - 11/2022)  Foot exam: completed Microalbumin/creatinine ratio, urine: completed today, results pending   Continue meds as prescribed Check blood sugars at home Contact office if increasing hypoglycemic events  Follow up in 6 months        Relevant Orders   POCT glycosylated hemoglobin (Hb A1C) (Completed)   Microalbumin/Creatinine Ratio, Urine     Other   Hyperlipidemia   Noted possible side effects of leg cramping   LDL well controlled on treatment   Recommended to hold atorvastatin x2 weeks and update on symptoms. If resolved, consider another statin. If unchanged, resume as prescribed.   Discussed increased water intake  Will await update       Follow up in 6 months for annual physical     Lindell Spar, RN

## 2023-06-10 NOTE — Assessment & Plan Note (Addendum)
 Worsening symptoms with initiation of Ozempic but improving.  Symptoms not persistent so will continue to monitor. Consider daily PPI or H2 blocker if needed.  I evaluated patient, was consulted regarding treatment, and agree with assessment and plan per Julaine Fusi, MSN, FNP student.   Mayra Reel, NP-C

## 2023-06-10 NOTE — Progress Notes (Signed)
 Subjective:    Patient ID: Mitchell Donovan, male    DOB: 1955/06/16, 68 y.o.   MRN: [id number]  HPI  Mitchell Donovan is a very pleasant 68 y.o. male with a history of hypertension, type 2 diabetes, hypothyroidism, hyperlipidemia who presents today for follow-up of diabetes.  Current medications include: Ozempic 0.5 mg weekly, metformin ER 1000 mg BID, Lantus 40 units daily, Jardiance 25 mg daily.   Intermittent heartburn and constipation since starting Ozempic.   He is checking his blood glucose 1 times daily and is getting readings of:  AM fasting: 90-135  Hypoglycemia: Infrequent.   Last A1C: 8.6 in August 2024, 6.9 today. Last Eye Exam: UTD Last Foot Exam: Due Pneumonia Vaccination: Pneumovax 23 in 2020 Urine Microalbumin: Due Statin: atorvastatin   Dietary changes since last visit: Eating smaller portion sizes, eliminating pastas. Increased protein and veggie. Limiting sweets. Limited water intake, 2 cocktails daily   Exercise: Several times weekly.   BP Readings from Last 3 Encounters:  06/10/23 (!) 164/86  01/07/23 132/70  11/29/22 138/80       Review of Systems  Respiratory:  Negative for shortness of breath.   Cardiovascular:  Negative for chest pain.  Endocrine: Negative for polydipsia, polyphagia and polyuria.  Neurological:  Positive for numbness.         Past Medical History:  Diagnosis Date   Essential hypertension    on meds   GERD (gastroesophageal reflux disease)    with certain foods/OTC PRN meds   Hyperlipidemia    on meds   Hypothyroidism    on meds   Migraines    Overactive bladder    Skin cancer    Type 2 diabetes mellitus (HCC)    on meds    Social History   Socioeconomic History   Marital status: Married    Spouse name: Not on file   Number of children: 1   Years of education: Not on file   Highest education level: Not on file  Occupational History   Occupation: Art gallery manager  Tobacco Use   Smoking status: Former     Types: Cigarettes, Cigars   Smokeless tobacco: Never   Tobacco comments:    in college, still occasionaly smoke cigars  Vaping Use   Vaping status: Never Used  Substance and Sexual Activity   Alcohol use: Yes    Alcohol/week: 12.0 standard drinks of alcohol    Types: 12 Standard drinks or equivalent per week   Drug use: Never   Sexual activity: Not on file  Other Topics Concern   Not on file  Social History Narrative   Married.   1 child.   Works as an Art gallery manager for [employer]   Enjoys golfing, fishing, traveling.    Social Drivers of Corporate investment banker Strain: Not on file  Food Insecurity: Not on file  Transportation Needs: Not on file  Physical Activity: Not on file  Stress: Not on file  Social Connections: Not on file  Intimate Partner Violence: Not on file    Past Surgical History:  Procedure Laterality Date   COLONOSCOPY  2011   in [place]   HEMORRHOID BANDING     in office   KNEE SURGERY Left 1988   LASIK  2008   MOHS SURGERY  2006   Nose   WISDOM TOOTH EXTRACTION      Family History  Problem Relation Age of Onset   Heart disease Mother    COPD Mother  Diabetes Mother    Heart disease Father    Hyperlipidemia Father    Diabetes Sister    Stomach cancer Neg Hx    Rectal cancer Neg Hx    Esophageal cancer Neg Hx    Colon cancer Neg Hx    Colon polyps Neg Hx     Allergies  Allergen Reactions   Sporanos [Itraconazole] Swelling    Current Outpatient Medications on File Prior to Visit  Medication Sig Dispense Refill   aspirin EC 81 MG tablet Take 81 mg by mouth daily. Swallow whole.     atorvastatin (LIPITOR) 20 MG tablet TAKE 1 TABLET DAILY FOR    CHOLESTEROL 90 tablet 2   finasteride (PROSCAR) 5 MG tablet TAKE 1 TABLET DAILY FOR    URINE STREAM 90 tablet 2   Insulin Pen Needle (BD PEN NEEDLE NANO 2ND GEN) 32G X 4 MM MISC Use daily with insulin. 100 each 1   JARDIANCE 25 MG TABS tablet TAKE 1 TABLET DAILY FOR    DIABETES 90 tablet 0    LANTUS SOLOSTAR 100 UNIT/ML Solostar Pen INJECT 40 UNITS INTO THE   SKIN DAILY FOR DIABETES 45 mL 0   levothyroxine (SYNTHROID) 75 MCG tablet TAKE 1 TABLET EVERY MORNING ON AN EMPTY STOMACH WITH WATER, NO FOOD OR OTHER MEDICATIONS FOR 30 MINUTES 90 tablet 3   metFORMIN (GLUCOPHAGE-XR) 500 MG 24 hr tablet TAKE 2 TABLETS TWICE A DAY WITH A MEAL FOR DIABETES 360 tablet 0   olmesartan (BENICAR) 20 MG tablet TAKE 1 TABLET DAILY FOR    BLOOD PRESSURE 90 tablet 3   ONETOUCH ULTRA test strip Use as instructed to test blood sugar up to 4 times daily 300 each 3   Semaglutide,0.25 or 0.5MG /DOS, (OZEMPIC, 0.25 OR 0.5 MG/DOSE,) 2 MG/3ML SOPN Inject 0.25 mg into the skin once weekly for 4 weeks, then increase to 0.5 mg once weekly thereafter for diabetes 9 mL 1   tadalafil (CIALIS) 5 MG tablet TAKE ONE TABLET BY MOUTH ONE TIME DAILY FOR URINE FLOW 90 tablet 3   No current facility-administered medications on file prior to visit.    BP (!) 164/86   Pulse 91   Temp (!) 97.1 F (36.2 C) (Temporal)   Ht 5\' 8"  (1.727 m)   Wt 180 lb (81.6 kg)   SpO2 97%   BMI 27.37 kg/m  Objective:   Physical Exam Cardiovascular:     Rate and Rhythm: Normal rate and regular rhythm.  Pulmonary:     Effort: Pulmonary effort is normal.     Breath sounds: Normal breath sounds.  Musculoskeletal:     Cervical back: Neck supple.  Skin:    General: Skin is warm and dry.  Neurological:     Mental Status: He is alert and oriented to person, place, and time.  Psychiatric:        Mood and Affect: Mood normal.           Assessment & Plan:  Type 2 diabetes mellitus with hyperglycemia, with long-term current use of insulin (HCC) Assessment & Plan: Improved with A1c 6.9% (previous 8.6% - 11/2022)  Foot exam: completed Microalbumin/creatinine ratio, urine: completed today, results pending   Continue meds as prescribed Check blood sugars at home Contact office if increasing hypoglycemic events  Follow up in 6  months   I evaluated patient, was consulted regarding treatment, and agree with assessment and plan per Julaine Fusi, MSN, FNP student.   Mayra Reel, NP-C  Orders: -     POCT glycosylated hemoglobin (Hb A1C) -     Microalbumin / creatinine urine ratio  Essential hypertension Assessment & Plan: Deteriorated with BP 164/86 today. Home readings: SBP 140s  Will monitor BP at home once daily and send readings for review via MyChart in 7 days.  Consider increase in olmesartan to 40mg  daily if not improved  Will await update.  I evaluated patient, was consulted regarding treatment, and agree with assessment and plan per Julaine Fusi, MSN, FNP student.   Mayra Reel, NP-C    Hyperlipidemia, unspecified hyperlipidemia type Assessment & Plan: Noted possible side effects of leg cramping   LDL well controlled on treatment   Recommended to hold atorvastatin x2 weeks and update on symptoms. If resolved, consider another statin. If unchanged, resume as prescribed.   Discussed increased water intake  Will await update  I evaluated patient, was consulted regarding treatment, and agree with assessment and plan per Julaine Fusi, MSN, FNP student.   Mayra Reel, NP-C    Gastroesophageal reflux disease, unspecified whether esophagitis present Assessment & Plan: Worsening symptoms with initiation of Ozempic but improving.  Symptoms not persistent so will continue to monitor. Consider daily PPI or H2 blocker if needed.  I evaluated patient, was consulted regarding treatment, and agree with assessment and plan per Julaine Fusi, MSN, FNP student.   Mayra Reel, NP-C          Doreene Nest, NP

## 2023-06-10 NOTE — Assessment & Plan Note (Addendum)
 Noted possible side effects of leg cramping   LDL well controlled on treatment   Recommended to hold atorvastatin x2 weeks and update on symptoms. If resolved, consider another statin. If unchanged, resume as prescribed.   Discussed increased water intake  Will await update  I evaluated patient, was consulted regarding treatment, and agree with assessment and plan per Julaine Fusi, MSN, FNP student.   Mayra Reel, NP-C

## 2023-06-10 NOTE — Assessment & Plan Note (Addendum)
 Deteriorated with BP 164/86 today. Home readings: SBP 140s  Will monitor BP at home once daily and send readings for review via MyChart in 7 days.  Consider increase in olmesartan to 40mg  daily if not improved  Will await update.  I evaluated patient, was consulted regarding treatment, and agree with assessment and plan per Julaine Fusi, MSN, FNP student.   Mayra Reel, NP-C

## 2023-06-11 LAB — MICROALBUMIN / CREATININE URINE RATIO
Creatinine,U: 84.4 mg/dL
Microalb Creat Ratio: 9.4 mg/g (ref 0.0–30.0)
Microalb, Ur: 0.8 mg/dL (ref 0.0–1.9)

## 2023-06-27 ENCOUNTER — Other Ambulatory Visit: Payer: Self-pay | Admitting: Primary Care

## 2023-06-27 DIAGNOSIS — E1165 Type 2 diabetes mellitus with hyperglycemia: Secondary | ICD-10-CM

## 2023-07-27 ENCOUNTER — Other Ambulatory Visit: Payer: Self-pay | Admitting: Primary Care

## 2023-07-27 DIAGNOSIS — E119 Type 2 diabetes mellitus without complications: Secondary | ICD-10-CM

## 2023-08-06 DIAGNOSIS — I10 Essential (primary) hypertension: Secondary | ICD-10-CM

## 2023-08-07 MED ORDER — OLMESARTAN MEDOXOMIL 20 MG PO TABS
20.0000 mg | ORAL_TABLET | Freq: Two times a day (BID) | ORAL | 1 refills | Status: DC
Start: 2023-08-07 — End: 2024-01-02

## 2023-08-20 ENCOUNTER — Other Ambulatory Visit: Payer: Self-pay | Admitting: Primary Care

## 2023-08-20 DIAGNOSIS — E1165 Type 2 diabetes mellitus with hyperglycemia: Secondary | ICD-10-CM

## 2023-09-11 ENCOUNTER — Other Ambulatory Visit: Payer: Self-pay | Admitting: Primary Care

## 2023-09-11 DIAGNOSIS — E119 Type 2 diabetes mellitus without complications: Secondary | ICD-10-CM

## 2023-10-01 ENCOUNTER — Other Ambulatory Visit: Payer: Self-pay | Admitting: Primary Care

## 2023-10-01 DIAGNOSIS — E785 Hyperlipidemia, unspecified: Secondary | ICD-10-CM

## 2023-10-25 ENCOUNTER — Other Ambulatory Visit: Payer: Self-pay | Admitting: Primary Care

## 2023-10-25 DIAGNOSIS — R3912 Poor urinary stream: Secondary | ICD-10-CM

## 2023-11-13 ENCOUNTER — Other Ambulatory Visit: Payer: Self-pay | Admitting: Primary Care

## 2023-11-13 DIAGNOSIS — E1165 Type 2 diabetes mellitus with hyperglycemia: Secondary | ICD-10-CM

## 2023-11-27 ENCOUNTER — Other Ambulatory Visit: Payer: Self-pay | Admitting: Primary Care

## 2023-11-27 DIAGNOSIS — E039 Hypothyroidism, unspecified: Secondary | ICD-10-CM

## 2023-11-27 DIAGNOSIS — E1165 Type 2 diabetes mellitus with hyperglycemia: Secondary | ICD-10-CM

## 2023-11-27 DIAGNOSIS — Z794 Long term (current) use of insulin: Secondary | ICD-10-CM

## 2023-12-10 ENCOUNTER — Encounter: Payer: Commercial Managed Care - PPO | Admitting: Primary Care

## 2023-12-13 ENCOUNTER — Other Ambulatory Visit: Payer: Self-pay | Admitting: Primary Care

## 2023-12-13 DIAGNOSIS — R3912 Poor urinary stream: Secondary | ICD-10-CM

## 2023-12-13 DIAGNOSIS — R3915 Urgency of urination: Secondary | ICD-10-CM

## 2023-12-17 ENCOUNTER — Encounter: Admitting: Primary Care

## 2023-12-30 ENCOUNTER — Other Ambulatory Visit: Payer: Self-pay | Admitting: Primary Care

## 2023-12-30 DIAGNOSIS — E785 Hyperlipidemia, unspecified: Secondary | ICD-10-CM

## 2023-12-30 DIAGNOSIS — I10 Essential (primary) hypertension: Secondary | ICD-10-CM

## 2024-01-02 ENCOUNTER — Encounter: Payer: Self-pay | Admitting: Primary Care

## 2024-01-02 ENCOUNTER — Ambulatory Visit (INDEPENDENT_AMBULATORY_CARE_PROVIDER_SITE_OTHER): Admitting: Primary Care

## 2024-01-02 VITALS — BP 142/80 | HR 100 | Temp 97.6°F | Ht 68.0 in | Wt 178.0 lb

## 2024-01-02 DIAGNOSIS — Z125 Encounter for screening for malignant neoplasm of prostate: Secondary | ICD-10-CM | POA: Diagnosis not present

## 2024-01-02 DIAGNOSIS — Z0000 Encounter for general adult medical examination without abnormal findings: Secondary | ICD-10-CM

## 2024-01-02 DIAGNOSIS — K219 Gastro-esophageal reflux disease without esophagitis: Secondary | ICD-10-CM | POA: Diagnosis not present

## 2024-01-02 DIAGNOSIS — E785 Hyperlipidemia, unspecified: Secondary | ICD-10-CM | POA: Diagnosis not present

## 2024-01-02 DIAGNOSIS — E1165 Type 2 diabetes mellitus with hyperglycemia: Secondary | ICD-10-CM | POA: Diagnosis not present

## 2024-01-02 DIAGNOSIS — Z794 Long term (current) use of insulin: Secondary | ICD-10-CM

## 2024-01-02 DIAGNOSIS — E039 Hypothyroidism, unspecified: Secondary | ICD-10-CM

## 2024-01-02 DIAGNOSIS — R3912 Poor urinary stream: Secondary | ICD-10-CM

## 2024-01-02 NOTE — Assessment & Plan Note (Signed)
Immunizations UTD. Declines influenza vaccine.  Colonoscopy UTD, due 2029 PSA due and pending.  Discussed the importance of a healthy diet and regular exercise in order for weight loss, and to reduce the risk of further co-morbidity.  Exam stable. Labs pending.  Follow up in 1 year for repeat physical.

## 2024-01-02 NOTE — Assessment & Plan Note (Signed)
 Repeat A1C pending.  Continue Ozempic  0.5 mg weekly. Continue Jardiance  25 mg daily. Continue metformin  ER 2000 mg BID. Continue Lantus  40 units daily.  Follow up in 3-6 months.

## 2024-01-02 NOTE — Assessment & Plan Note (Signed)
 He is taking levothyroxine  correctly.   Repeat TSH pending. Continue levothyroxine  75 mcg daily.

## 2024-01-02 NOTE — Assessment & Plan Note (Signed)
 Stable.  Continue Cialis  5 mg daily and finasteride  5 mg daily.

## 2024-01-02 NOTE — Assessment & Plan Note (Signed)
 Controlled. ? ?Continue famotidine 20 mg daily.  ?

## 2024-01-02 NOTE — Progress Notes (Signed)
 Subjective:    Patient ID: Mitchell Donovan, male    DOB: 08-25-1955, 68 y.o.   MRN: [id number]  Mitchell Donovan is a very pleasant 68 y.o. male who presents today for complete physical and follow up of chronic conditions.    Immunizations: -Tetanus: Completed in 2015 -Influenza: Declines influenza vaccine.  -Shingles: Completed Shingrix  series -Pneumonia: Completed Pneumovax 23 in 2020  Diet: Fair diet.  Exercise: Walking daily. Lifting weights.   Eye exam: Completes annually  Dental exam: Completes semi-annually    Colonoscopy: Completed in 2022, due 2029  PSA: Due    BP Readings from Last 3 Encounters:  01/02/24 (!) 142/80  06/10/23 (!) 164/86  01/07/23 132/70   Wt Readings from Last 3 Encounters:  01/02/24 178 lb (80.7 kg)  06/10/23 180 lb (81.6 kg)  01/07/23 180 lb 6 oz (81.8 kg)      Review of Systems  Constitutional:  Negative for unexpected weight change.  HENT:  Negative for rhinorrhea.   Respiratory:  Negative for cough and shortness of breath.   Cardiovascular:  Negative for chest pain.  Gastrointestinal:  Negative for constipation and diarrhea.  Genitourinary:  Negative for difficulty urinating.  Musculoskeletal:  Positive for arthralgias and myalgias.  Skin:  Negative for rash.  Allergic/Immunologic: Negative for environmental allergies.  Neurological:  Positive for numbness. Negative for dizziness and headaches.  Psychiatric/Behavioral:  The patient is not nervous/anxious.          Past Medical History:  Diagnosis Date   Arthropod bite 11/01/2019   COVID-19 virus infection 11/13/2022   Essential hypertension    on meds   Gastroenteritis 06/08/2021   GERD (gastroesophageal reflux disease)    with certain foods/OTC PRN meds   Hyperlipidemia    on meds   Hypothyroidism    on meds   Migraines    Overactive bladder    Skin cancer    Type 2 diabetes mellitus (HCC)    on meds    Social History   Socioeconomic History   Marital  status: Married    Spouse name: Not on file   Number of children: 1   Years of education: Not on file   Highest education level: Not on file  Occupational History   Occupation: Art gallery manager  Tobacco Use   Smoking status: Former    Types: Cigarettes, Cigars   Smokeless tobacco: Never   Tobacco comments:    in college, still occasionaly smoke cigars  Vaping Use   Vaping status: Never Used  Substance and Sexual Activity   Alcohol use: Yes    Alcohol/week: 12.0 standard drinks of alcohol    Types: 12 Standard drinks or equivalent per week   Drug use: Never   Sexual activity: Not on file  Other Topics Concern   Not on file  Social History Narrative   Married.   1 child.   Works as an Art gallery manager for [employer]   Enjoys golfing, fishing, traveling.    Social Drivers of Corporate investment banker Strain: Not on file  Food Insecurity: Not on file  Transportation Needs: Not on file  Physical Activity: Not on file  Stress: Not on file  Social Connections: Not on file  Intimate Partner Violence: Not on file    Past Surgical History:  Procedure Laterality Date   COLONOSCOPY  2011   in [place]    HEMORRHOID BANDING     in office   KNEE SURGERY Left 1988   LASIK  2008  MOHS SURGERY  2006   Nose   WISDOM TOOTH EXTRACTION      Family History  Problem Relation Age of Onset   Heart disease Mother    COPD Mother    Diabetes Mother    Heart disease Father    Hyperlipidemia Father    Diabetes Sister    Stomach cancer Neg Hx    Rectal cancer Neg Hx    Esophageal cancer Neg Hx    Colon cancer Neg Hx    Colon polyps Neg Hx     Allergies  Allergen Reactions   Sporanos [Itraconazole] Swelling    Current Outpatient Medications on File Prior to Visit  Medication Sig Dispense Refill   aspirin EC 81 MG tablet Take 81 mg by mouth daily. Swallow whole.     atorvastatin  (LIPITOR) 20 MG tablet TAKE 1 TABLET DAILY FOR    CHOLESTEROL 90 tablet 0   BD PEN NEEDLE NANO 2ND GEN 32G  X 4 MM MISC USE DAILY WITH INSULIN  90 each 1   empagliflozin  (JARDIANCE ) 25 MG TABS tablet TAKE 1 TABLET DAILY FOR    DIABETES 90 tablet 1   finasteride  (PROSCAR ) 5 MG tablet TAKE 1 TABLET DAILY FOR    URINE STREAM 90 tablet 0   insulin  glargine (LANTUS  SOLOSTAR) 100 UNIT/ML Solostar Pen INJECT 40 UNITS            SUBCUTANEOUSLY DAILY FOR   DIABETES 45 mL 0   levothyroxine  (SYNTHROID ) 75 MCG tablet TAKE 1 TABLET EVERY MORNING ON AN EMPTY STOMACH WITH WATER, NO FOOD OROTHER MEDICATIONS FOR 30 MINUTES 90 tablet 0   metFORMIN  (GLUCOPHAGE -XR) 500 MG 24 hr tablet TAKE 2 TABLETS TWICE A DAY WITH A MEAL FOR DIABETES 360 tablet 0   olmesartan  (BENICAR ) 20 MG tablet Take 1 tablet (20 mg total) by mouth 2 (two) times daily. for blood pressure 180 tablet 1   ONETOUCH ULTRA test strip Use as instructed to test blood sugar up to 4 times daily 300 each 3   Semaglutide ,0.25 or 0.5MG /DOS, (OZEMPIC , 0.25 OR 0.5 MG/DOSE,) 2 MG/3ML SOPN INJECT 0.5MG  SUBCUTANEOUSLYONCE A WEEK FOR DIABETES 9 mL 0   tadalafil  (CIALIS ) 5 MG tablet TAKE ONE TABLET BY MOUTH ONE TIME DAILY FOR URINE FLOW 90 tablet 0   No current facility-administered medications on file prior to visit.    BP (!) 142/80   Pulse 100   Temp 97.6 F (36.4 C) (Temporal)   Ht 5' 8 (1.727 m)   Wt 178 lb (80.7 kg)   SpO2 96%   BMI 27.06 kg/m  Objective:   Physical Exam HENT:     Right Ear: Tympanic membrane and ear canal normal.     Left Ear: Tympanic membrane and ear canal normal.  Eyes:     Pupils: Pupils are equal, round, and reactive to light.  Cardiovascular:     Rate and Rhythm: Normal rate and regular rhythm.  Pulmonary:     Effort: Pulmonary effort is normal.     Breath sounds: Normal breath sounds.  Abdominal:     General: Bowel sounds are normal.     Palpations: Abdomen is soft.     Tenderness: There is no abdominal tenderness.  Musculoskeletal:        General: Normal range of motion.     Cervical back: Neck supple.  Skin:     General: Skin is warm and dry.  Neurological:     Mental Status: He is alert and oriented to  person, place, and time.     Cranial Nerves: No cranial nerve deficit.     Deep Tendon Reflexes:     Reflex Scores:      Patellar reflexes are 2+ on the right side and 2+ on the left side. Psychiatric:        Mood and Affect: Mood normal.     Physical Exam        Assessment & Plan:  Preventative health care Assessment & Plan: Immunizations UTD. Declines influenza vaccine.  Colonoscopy UTD, due 2029 PSA due and pending.  Discussed the importance of a healthy diet and regular exercise in order for weight loss, and to reduce the risk of further co-morbidity.  Exam stable. Labs pending.  Follow up in 1 year for repeat physical.    Gastroesophageal reflux disease, unspecified whether esophagitis present Assessment & Plan: Controlled.  Continue famotidine 20 mg daily.    Type 2 diabetes mellitus with hyperglycemia, with long-term current use of insulin  (HCC) Assessment & Plan: Repeat A1C pending.  Continue Ozempic  0.5 mg weekly. Continue Jardiance  25 mg daily. Continue metformin  ER 2000 mg BID. Continue Lantus  40 units daily.  Follow up in 3-6 months.  Orders: -     Hemoglobin A1c  Hypothyroidism, unspecified type Assessment & Plan: He is taking levothyroxine  correctly.   Repeat TSH pending. Continue levothyroxine  75 mcg daily.   Weak urinary stream Assessment & Plan: Stable.  Continue Cialis  5 mg daily and finasteride  5 mg daily.   Hyperlipidemia, unspecified hyperlipidemia type Assessment & Plan: Repeat lipid panel pending.  Continue atorvastatin  20 mg daily.   Orders: -     Comprehensive metabolic panel with GFR -     Lipid panel  Screening for prostate cancer -     PSA    Assessment and Plan Assessment & Plan         Comer MARLA Gaskins, NP    History of Present Illness

## 2024-01-02 NOTE — Patient Instructions (Addendum)
 Stop by the lab prior to leaving today. I will notify you of your results once received.   Please schedule a follow up visit for 6 months for a diabetes check.  It was a pleasure to see you today!

## 2024-01-02 NOTE — Assessment & Plan Note (Signed)
 Repeat lipid panel pending. Continue atorvastatin 20 mg daily.

## 2024-01-03 ENCOUNTER — Ambulatory Visit: Payer: Self-pay | Admitting: Primary Care

## 2024-01-03 DIAGNOSIS — E1165 Type 2 diabetes mellitus with hyperglycemia: Secondary | ICD-10-CM

## 2024-01-03 LAB — COMPREHENSIVE METABOLIC PANEL WITH GFR
ALT: 25 U/L (ref 0–53)
AST: 17 U/L (ref 0–37)
Albumin: 4.5 g/dL (ref 3.5–5.2)
Alkaline Phosphatase: 89 U/L (ref 39–117)
BUN: 16 mg/dL (ref 6–23)
CO2: 26 meq/L (ref 19–32)
Calcium: 9.8 mg/dL (ref 8.4–10.5)
Chloride: 104 meq/L (ref 96–112)
Creatinine, Ser: 0.84 mg/dL (ref 0.40–1.50)
GFR: 91.31 mL/min (ref >60.00)
Glucose, Bld: 118 mg/dL — ABNORMAL HIGH (ref 70–99)
Potassium: 4.1 meq/L (ref 3.5–5.1)
Sodium: 141 meq/L (ref 135–145)
Total Bilirubin: 0.6 mg/dL (ref 0.2–1.2)
Total Protein: 6.5 g/dL (ref 6.0–8.3)

## 2024-01-03 LAB — HEMOGLOBIN A1C: Hgb A1c MFr Bld: 7.6 % — ABNORMAL HIGH (ref 4.6–6.5)

## 2024-01-03 LAB — LIPID PANEL
Cholesterol: 111 mg/dL (ref 0–200)
HDL: 42.7 mg/dL (ref >39.00)
LDL Cholesterol: 23 mg/dL (ref 0–99)
NonHDL: 68.36
Total CHOL/HDL Ratio: 3
Triglycerides: 226 mg/dL — ABNORMAL HIGH (ref 0.0–149.0)
VLDL: 45.2 mg/dL — ABNORMAL HIGH (ref 0.0–40.0)

## 2024-01-03 LAB — PSA: PSA: 0.42 ng/mL (ref 0.10–4.00)

## 2024-01-20 ENCOUNTER — Other Ambulatory Visit: Payer: Self-pay | Admitting: Primary Care

## 2024-01-20 DIAGNOSIS — R3912 Poor urinary stream: Secondary | ICD-10-CM

## 2024-01-20 DIAGNOSIS — E119 Type 2 diabetes mellitus without complications: Secondary | ICD-10-CM

## 2024-02-05 ENCOUNTER — Other Ambulatory Visit: Payer: Self-pay | Admitting: Primary Care

## 2024-02-05 DIAGNOSIS — E1165 Type 2 diabetes mellitus with hyperglycemia: Secondary | ICD-10-CM

## 2024-02-25 ENCOUNTER — Other Ambulatory Visit: Payer: Self-pay | Admitting: Primary Care

## 2024-02-25 DIAGNOSIS — Z794 Long term (current) use of insulin: Secondary | ICD-10-CM

## 2024-02-25 DIAGNOSIS — E039 Hypothyroidism, unspecified: Secondary | ICD-10-CM

## 2024-03-06 ENCOUNTER — Telehealth: Payer: Self-pay | Admitting: Primary Care

## 2024-03-06 NOTE — Telephone Encounter (Signed)
 Copied from CRM [id number]. Topic: Clinical - Medication Question >> Mar 06, 2024  1:49 PM Roselie BROCKS wrote: Reason for CRM: [facility] at [facility] called about medication ,lmesartan (BENICAR ) 20 MG tablet. She states she has three scripts, 2 are for 20 mg and one for 40 mg and needing to know which if correct to fill Return phone number [phone] option 2 reference number [phone] , [facility] pending call back.

## 2024-03-10 ENCOUNTER — Telehealth: Payer: Self-pay | Admitting: Primary Care

## 2024-03-10 NOTE — Telephone Encounter (Unsigned)
 Copied from CRM #[id number]. Topic: Clinical - Medication Question >> Mar 09, 2024  3:10 PM Franky GRADE wrote: Reason for CRM: [facility] is calling for clarification on olmesartan  (BENICAR ) 20 MG tablet [[id number]], as they received two different prescriptions with different instructions. Best call back number [phone] option 2 and the order number [id number]. >> Mar 10, 2024 11:18 AM Rosina BIRCH wrote: Lorn from [facility] called stating she need clarification on the benicar  for the patient. Lorn states one prescription says twice a day and the other says once a day. Lorn will place the prescription on hold because they need an actual callback or the prescription will not be filled [phone] option 2 Reference-[id number]

## 2024-03-10 NOTE — Telephone Encounter (Signed)
 Called was on phone for 17 minutes to get verified. Information given to pharmacy will call if any further questions.

## 2024-03-10 NOTE — Telephone Encounter (Signed)
 Copied from CRM #[id number]. Topic: Clinical - Medication Question >> Mar 09, 2024  3:10 PM Franky GRADE wrote: Reason for CRM: [facility] is calling for clarification on olmesartan  (BENICAR ) 20 MG tablet [[id number]], as they received two different prescriptions with different instructions. Best call back number [phone] option 2 and the order number [id number].

## 2024-03-10 NOTE — Telephone Encounter (Signed)
 Patient is managed on olmesartan  20 mg once daily. Thanks!

## 2024-03-10 NOTE — Telephone Encounter (Signed)
 See duplicate phone note

## 2024-03-21 ENCOUNTER — Other Ambulatory Visit: Payer: Self-pay | Admitting: Primary Care

## 2024-03-21 DIAGNOSIS — R3915 Urgency of urination: Secondary | ICD-10-CM

## 2024-03-21 DIAGNOSIS — R3912 Poor urinary stream: Secondary | ICD-10-CM

## 2024-03-24 ENCOUNTER — Ambulatory Visit

## 2024-03-24 DIAGNOSIS — Z23 Encounter for immunization: Secondary | ICD-10-CM

## 2024-03-24 NOTE — Progress Notes (Signed)
 Per orders of Mallie Gaskins, DPN AGNP-C, injection of reg dose flu vaccine per pt request. given by Laray Arenas in left deltoid. Patient tolerated injection well.

## 2024-04-06 ENCOUNTER — Ambulatory Visit: Payer: Self-pay | Admitting: Primary Care

## 2024-04-06 ENCOUNTER — Other Ambulatory Visit

## 2024-04-06 DIAGNOSIS — Z794 Long term (current) use of insulin: Secondary | ICD-10-CM | POA: Diagnosis not present

## 2024-04-06 DIAGNOSIS — E039 Hypothyroidism, unspecified: Secondary | ICD-10-CM | POA: Diagnosis not present

## 2024-04-06 DIAGNOSIS — E1165 Type 2 diabetes mellitus with hyperglycemia: Secondary | ICD-10-CM | POA: Diagnosis not present

## 2024-04-06 LAB — HEMOGLOBIN A1C: Hgb A1c MFr Bld: 7.2 % — ABNORMAL HIGH (ref 4.6–6.5)

## 2024-04-06 LAB — TSH: TSH: 1.42 u[IU]/mL (ref 0.35–5.50)

## 2024-04-15 ENCOUNTER — Other Ambulatory Visit: Payer: Self-pay | Admitting: Primary Care

## 2024-04-15 DIAGNOSIS — E119 Type 2 diabetes mellitus without complications: Secondary | ICD-10-CM

## 2024-04-15 NOTE — Telephone Encounter (Signed)
 Patient is due for diabetes follow up in mid March, this will be required prior to any further refills.  Please schedule, thank you!

## 2024-04-23 ENCOUNTER — Encounter: Payer: Self-pay | Admitting: Primary Care

## 2024-04-23 ENCOUNTER — Ambulatory Visit (INDEPENDENT_AMBULATORY_CARE_PROVIDER_SITE_OTHER): Admitting: Primary Care

## 2024-04-23 DIAGNOSIS — E1165 Type 2 diabetes mellitus with hyperglycemia: Secondary | ICD-10-CM

## 2024-04-23 DIAGNOSIS — Z794 Long term (current) use of insulin: Secondary | ICD-10-CM | POA: Diagnosis not present

## 2024-04-23 MED ORDER — OZEMPIC (0.25 OR 0.5 MG/DOSE) 2 MG/3ML ~~LOC~~ SOPN: 0.5000 mg | PEN_INJECTOR | SUBCUTANEOUS | 1 refills | Status: DC

## 2024-04-23 NOTE — Patient Instructions (Signed)
 Continue medications as is for now.  Please notify me when you have about 30 pills remaining..  We can try to switch to Farxiga from Jardiance  for diabetes.  If your glucose levels start dropping below 80 consistently then reduce your Lantus  by 5 units every 3 to 5 days.  Please update me.  Continue Ozempic  0.5 mg weekly.  Hold your atorvastatin  medication for 2 weeks to see if this helps with joint aches.  Schedule your welcome to Medicare visit for July.

## 2024-04-23 NOTE — Assessment & Plan Note (Signed)
 Controlled and improved with A1C of 7.2 today.  Continue Lantus  40 units for now. We discussed to monitor glucose and reduce Lantus  by 5 units. He will update.  Continue Jardiance  25 mg daily for now, but once he is out this will become cost prohibitive. He will update.  Try Farxiga at this point. Continue Ozempic  0.5 mg weekly. Continue metformin  ER 1000 mg twice daily.  Commended him on his activity level. Holding atorvastatin  x 2 weeks given muscle and joint aches.

## 2024-04-23 NOTE — Progress Notes (Signed)
 "  Subjective:    Patient ID: Mitchell Donovan, male    DOB: Jul 25, 1955, 69 y.o.   MRN: [id number]  Mitchell Donovan is a very pleasant 69 y.o. male with a Struve hypertension, type 2 diabetes, hyperlipidemia who presents today for follow-up of diabetes.  1) Type 2 Diabetes:  Current medications include: Jardiance  25 mg daily, metformin  ER 1000 mg twice daily, Lantus  40 units daily, Ozempic  0.5 mg weekly.  He is checking his blood glucose 1 times daily and is getting readings of:  AM fasting: 90s to low 100s.   Last A1C: 7.6 in September 2025, 7.2 in December 2025 Last Eye Exam: Due Last Foot Exam: Up-to-date Pneumonia Vaccination: 2020 Urine Microalbumin: Up-to-date Statin: Atorvastatin  - increased joint aches.  I asked my wife what you might dealing with today he said he was  Dietary changes since last visit: Switched to Almond milk, stopped eating muffins, snacking on fruit, eating more salads. Reduced appetite.    Exercise: Stays active.   Wt Readings from Last 3 Encounters:  04/23/24 172 lb 6 oz (78.2 kg)  01/02/24 178 lb (80.7 kg)  06/10/23 180 lb (81.6 kg)       Review of Systems  Eyes:  Negative for visual disturbance.  Respiratory:  Negative for shortness of breath.   Cardiovascular:  Negative for chest pain.  Neurological:  Negative for numbness.         Past Medical History:  Diagnosis Date   Arthropod bite 11/01/2019   COVID-19 virus infection 11/13/2022   Essential hypertension    on meds   Gastroenteritis 06/08/2021   GERD (gastroesophageal reflux disease)    with certain foods/OTC PRN meds   Hyperlipidemia    on meds   Hypothyroidism    on meds   Migraines    Overactive bladder    Skin cancer    Type 2 diabetes mellitus (HCC)    on meds    Social History   Socioeconomic History   Marital status: Married    Spouse name: Not on file   Number of children: 1   Years of education: Not on file   Highest education level: Not on file   Occupational History   Occupation: Art Gallery Manager  Tobacco Use   Smoking status: Former    Types: Cigarettes, Cigars   Smokeless tobacco: Never   Tobacco comments:    in college, still occasionaly smoke cigars  Vaping Use   Vaping status: Never Used  Substance and Sexual Activity   Alcohol use: Yes    Alcohol/week: 12.0 standard drinks of alcohol    Types: 12 Standard drinks or equivalent per week   Drug use: Never   Sexual activity: Not on file  Other Topics Concern   Not on file  Social History Narrative   Married.   1 child.   Works as an Art Gallery Manager for [employer]   Enjoys golfing, fishing, traveling.    Social Drivers of Health   Tobacco Use: Medium Risk (04/23/2024)   Patient History    Smoking Tobacco Use: Former    Smokeless Tobacco Use: Never    Passive Exposure: Not on file  Financial Resource Strain: Not on file  Food Insecurity: Not on file  Transportation Needs: Not on file  Physical Activity: Not on file  Stress: Not on file  Social Connections: Not on file  Intimate Partner Violence: Not on file  Depression (PHQ2-9): Low Risk (04/23/2024)   Depression (PHQ2-9)    PHQ-2 Score: 0  Alcohol Screen: Not on file  Housing: Not on file  Utilities: Not on file  Health Literacy: Not on file    Past Surgical History:  Procedure Laterality Date   COLONOSCOPY  2011   in [place]    HEMORRHOID BANDING     in office   KNEE SURGERY Left 1988   LASIK  2008   MOHS SURGERY  2006   Nose   WISDOM TOOTH EXTRACTION      Family History  Problem Relation Age of Onset   Heart disease Mother    COPD Mother    Diabetes Mother    Heart disease Father    Hyperlipidemia Father    Diabetes Sister    Stomach cancer Neg Hx    Rectal cancer Neg Hx    Esophageal cancer Neg Hx    Colon cancer Neg Hx    Colon polyps Neg Hx     Allergies[1]  Medications Ordered Prior to Encounter[2]  BP 136/68   Pulse 100   Temp 97.9 F (36.6 C) (Oral)   Ht 5' 8 (1.727 m)   Wt 172  lb 6 oz (78.2 kg)   SpO2 97%   BMI 26.21 kg/m  Objective:   Physical Exam Cardiovascular:     Rate and Rhythm: Normal rate and regular rhythm.  Pulmonary:     Effort: Pulmonary effort is normal.     Breath sounds: Normal breath sounds.  Musculoskeletal:     Cervical back: Neck supple.  Skin:    General: Skin is warm and dry.  Neurological:     Mental Status: He is alert and oriented to person, place, and time.  Psychiatric:        Mood and Affect: Mood normal.     Physical Exam        Assessment & Plan:  Type 2 diabetes mellitus with hyperglycemia, with long-term current use of insulin  (HCC) Assessment & Plan: Controlled and improved with A1C of 7.2 today.  Continue Lantus  40 units for now. We discussed to monitor glucose and reduce Lantus  by 5 units. He will update.  Continue Jardiance  25 mg daily for now, but once he is out this will become cost prohibitive. He will update.  Try Farxiga at this point. Continue Ozempic  0.5 mg weekly. Continue metformin  ER 1000 mg twice daily.  Commended him on his activity level. Holding atorvastatin  x 2 weeks given muscle and joint aches.  Orders: -     Ozempic  (0.25 or 0.5 MG/DOSE); Inject 0.5 mg into the skin once a week. for diabetes.  Dispense: 9 mL; Refill: 1    Assessment and Plan Assessment & Plan         Comer MARLA Gaskins, NP       [1]  Allergies Allergen Reactions   Sporanos [Itraconazole] Swelling  [2]  Current Outpatient Medications on File Prior to Visit  Medication Sig Dispense Refill   aspirin EC 81 MG tablet Take 81 mg by mouth daily. Swallow whole.     atorvastatin  (LIPITOR) 20 MG tablet TAKE 1 TABLET DAILY FOR    CHOLESTEROL 90 tablet 3   finasteride  (PROSCAR ) 5 MG tablet TAKE 1 TABLET DAILY FOR    URINE STREAM 90 tablet 2   Insulin  Pen Needle (EMBECTA PEN NEEDLE NANO 2 GEN) 32G X 4 MM MISC USE DAILY WITH INSULIN . 90 each 2   JARDIANCE  25 MG TABS tablet TAKE 1 TABLET DAILY FOR    DIABETES 90  tablet 0   LANTUS   SOLOSTAR 100 UNIT/ML Solostar Pen INJECT 40 UNITS            SUBCUTANEOUSLY DAILY FOR   DIABETES 45 mL 0   levothyroxine  (SYNTHROID ) 75 MCG tablet TAKE 1 TABLET EVERY MORNING ON AN EMPTY STOMACH WITH    WATER. NO FOODOR OTHER MEDICATIONS FOR 30 MINUTES 90 tablet 0   metFORMIN  (GLUCOPHAGE -XR) 500 MG 24 hr tablet TAKE 2 TABLETS TWICE A DAY WITH MEALS FOR DIABETES 360 tablet 1   olmesartan  (BENICAR ) 20 MG tablet TAKE 1 TABLET DAILY FOR    BLOOD PRESSURE 90 tablet 3   ONETOUCH ULTRA test strip Use as instructed to test blood sugar up to 4 times daily 300 each 3   tadalafil  (CIALIS ) 5 MG tablet TAKE ONE TABLET BY MOUTH ONE TIME DAILY FOR URINE FLOW 90 tablet 2   No current facility-administered medications on file prior to visit.   "

## 2024-04-24 DIAGNOSIS — Z794 Long term (current) use of insulin: Secondary | ICD-10-CM

## 2024-04-24 DIAGNOSIS — I10 Essential (primary) hypertension: Secondary | ICD-10-CM

## 2024-04-24 MED ORDER — OZEMPIC (0.25 OR 0.5 MG/DOSE) 2 MG/3ML ~~LOC~~ SOPN: 0.5000 mg | PEN_INJECTOR | SUBCUTANEOUS | 1 refills | Status: AC

## 2024-04-24 MED ORDER — OLMESARTAN MEDOXOMIL 20 MG PO TABS: 20.0000 mg | ORAL_TABLET | Freq: Every day | ORAL | 1 refills | Status: AC

## 2024-11-10 ENCOUNTER — Encounter: Admitting: Primary Care

## 2024-11-18 ENCOUNTER — Ambulatory Visit: Admitting: Primary Care
